# Patient Record
Sex: Male | Born: 1960 | Race: White | Hispanic: No | Marital: Married | Smoking: Never smoker
Health system: Southern US, Community
[De-identification: ages and names within clinical notes are randomized; demographics above are authoritative.]

## PROBLEM LIST (undated history)

## (undated) HISTORY — PX: NECK SURGERY: SHX720

## (undated) HISTORY — PX: BACK SURGERY: SHX140

---

## 1998-03-12 ENCOUNTER — Emergency Department (HOSPITAL_COMMUNITY): Admission: EM | Admit: 1998-03-12 | Discharge: 1998-03-12 | Payer: Self-pay | Admitting: Emergency Medicine

## 1998-06-29 ENCOUNTER — Encounter: Admission: RE | Admit: 1998-06-29 | Discharge: 1998-09-27 | Payer: Self-pay | Admitting: *Deleted

## 1998-07-03 ENCOUNTER — Encounter: Payer: Self-pay | Admitting: *Deleted

## 1998-07-03 ENCOUNTER — Ambulatory Visit (HOSPITAL_COMMUNITY): Admission: RE | Admit: 1998-07-03 | Discharge: 1998-07-03 | Payer: Self-pay | Admitting: *Deleted

## 1998-08-14 ENCOUNTER — Encounter: Payer: Self-pay | Admitting: *Deleted

## 1998-08-14 ENCOUNTER — Ambulatory Visit (HOSPITAL_COMMUNITY): Admission: RE | Admit: 1998-08-14 | Discharge: 1998-08-14 | Payer: Self-pay | Admitting: *Deleted

## 1998-09-15 ENCOUNTER — Encounter: Payer: Self-pay | Admitting: Neurosurgery

## 1998-09-15 ENCOUNTER — Ambulatory Visit (HOSPITAL_COMMUNITY): Admission: RE | Admit: 1998-09-15 | Discharge: 1998-09-15 | Payer: Self-pay | Admitting: Neurosurgery

## 1998-09-29 ENCOUNTER — Ambulatory Visit (HOSPITAL_COMMUNITY): Admission: RE | Admit: 1998-09-29 | Discharge: 1998-09-29 | Payer: Self-pay | Admitting: Neurosurgery

## 1998-09-29 ENCOUNTER — Encounter: Payer: Self-pay | Admitting: Neurosurgery

## 1998-10-13 ENCOUNTER — Ambulatory Visit (HOSPITAL_COMMUNITY): Admission: RE | Admit: 1998-10-13 | Discharge: 1998-10-13 | Payer: Self-pay | Admitting: Neurosurgery

## 1998-10-13 ENCOUNTER — Encounter: Payer: Self-pay | Admitting: Neurosurgery

## 1998-11-04 ENCOUNTER — Encounter: Payer: Self-pay | Admitting: Neurosurgery

## 1998-11-04 ENCOUNTER — Ambulatory Visit (HOSPITAL_COMMUNITY): Admission: RE | Admit: 1998-11-04 | Discharge: 1998-11-04 | Payer: Self-pay | Admitting: Neurosurgery

## 1999-01-01 ENCOUNTER — Encounter: Payer: Self-pay | Admitting: Neurosurgery

## 1999-01-05 ENCOUNTER — Encounter: Payer: Self-pay | Admitting: Neurosurgery

## 1999-01-05 ENCOUNTER — Inpatient Hospital Stay (HOSPITAL_COMMUNITY): Admission: RE | Admit: 1999-01-05 | Discharge: 1999-01-08 | Payer: Self-pay | Admitting: Neurosurgery

## 2000-03-15 ENCOUNTER — Encounter: Payer: Self-pay | Admitting: Neurosurgery

## 2000-03-15 ENCOUNTER — Encounter: Admission: RE | Admit: 2000-03-15 | Discharge: 2000-03-15 | Payer: Self-pay | Admitting: Neurosurgery

## 2001-02-21 ENCOUNTER — Ambulatory Visit (HOSPITAL_COMMUNITY): Admission: RE | Admit: 2001-02-21 | Discharge: 2001-02-21 | Payer: Self-pay | Admitting: Neurosurgery

## 2001-02-21 ENCOUNTER — Encounter: Payer: Self-pay | Admitting: Neurosurgery

## 2001-03-21 ENCOUNTER — Encounter: Admission: RE | Admit: 2001-03-21 | Discharge: 2001-03-21 | Payer: Self-pay | Admitting: Neurosurgery

## 2001-03-21 ENCOUNTER — Encounter: Payer: Self-pay | Admitting: Neurosurgery

## 2001-04-12 ENCOUNTER — Encounter: Payer: Self-pay | Admitting: Neurosurgery

## 2001-04-12 ENCOUNTER — Encounter: Admission: RE | Admit: 2001-04-12 | Discharge: 2001-04-12 | Payer: Self-pay | Admitting: Neurosurgery

## 2001-04-24 ENCOUNTER — Encounter: Payer: Self-pay | Admitting: Neurosurgery

## 2001-04-24 ENCOUNTER — Encounter: Admission: RE | Admit: 2001-04-24 | Discharge: 2001-04-24 | Payer: Self-pay | Admitting: Neurosurgery

## 2002-11-12 ENCOUNTER — Encounter: Payer: Self-pay | Admitting: Neurosurgery

## 2002-11-12 ENCOUNTER — Encounter: Payer: Self-pay | Admitting: Diagnostic Radiology

## 2002-11-12 ENCOUNTER — Encounter: Admission: RE | Admit: 2002-11-12 | Discharge: 2002-11-12 | Payer: Self-pay | Admitting: Neurosurgery

## 2002-11-26 ENCOUNTER — Encounter: Payer: Self-pay | Admitting: Neurosurgery

## 2002-11-26 ENCOUNTER — Encounter: Admission: RE | Admit: 2002-11-26 | Discharge: 2002-11-26 | Payer: Self-pay | Admitting: Neurosurgery

## 2003-07-10 ENCOUNTER — Encounter: Admission: RE | Admit: 2003-07-10 | Discharge: 2003-07-10 | Payer: Self-pay | Admitting: Internal Medicine

## 2003-09-08 ENCOUNTER — Encounter: Admission: RE | Admit: 2003-09-08 | Discharge: 2003-09-08 | Payer: Self-pay | Admitting: Neurosurgery

## 2004-04-16 ENCOUNTER — Inpatient Hospital Stay (HOSPITAL_COMMUNITY): Admission: AD | Admit: 2004-04-16 | Discharge: 2004-04-17 | Payer: Self-pay | Admitting: Neurosurgery

## 2004-05-11 ENCOUNTER — Encounter: Admission: RE | Admit: 2004-05-11 | Discharge: 2004-05-11 | Payer: Self-pay | Admitting: Neurosurgery

## 2004-06-01 ENCOUNTER — Encounter: Admission: RE | Admit: 2004-06-01 | Discharge: 2004-06-01 | Payer: Self-pay | Admitting: Neurosurgery

## 2005-06-03 ENCOUNTER — Encounter: Admission: RE | Admit: 2005-06-03 | Discharge: 2005-06-03 | Payer: Self-pay | Admitting: Neurosurgery

## 2005-10-28 ENCOUNTER — Encounter: Admission: RE | Admit: 2005-10-28 | Discharge: 2005-10-28 | Payer: Self-pay | Admitting: Neurosurgery

## 2007-10-24 IMAGING — RF DG MYELOGRAM CERVICAL
10 series · 10 of 10 positions shown · IV contrast (omnipaque)
Comparison: 06/03/05

CLINICAL DATA: Left C8 radiculopathy.
CERVICAL MYELOGRAM:
TECHNIQUE: The low back was prepped and draped in a sterile fashion. Lidocaine was utilized for local anesthesia.  Under fluoroscopic guidance, a 22 gauge spinal needle was inserted into the CSF space at L2-3 via right paramedian approach.  10 cc Omnipaque 300 was administered.  No complications were encountered.
TECHNIQUE: Multidetector CT imaging of the cervical spine was performed after intrathecal injection of contrast.  Multiplanar CT imaging reconstructions were also generated.

[Series 2: (hospital) · 1 of 1 slices shown]
[im 1/1]
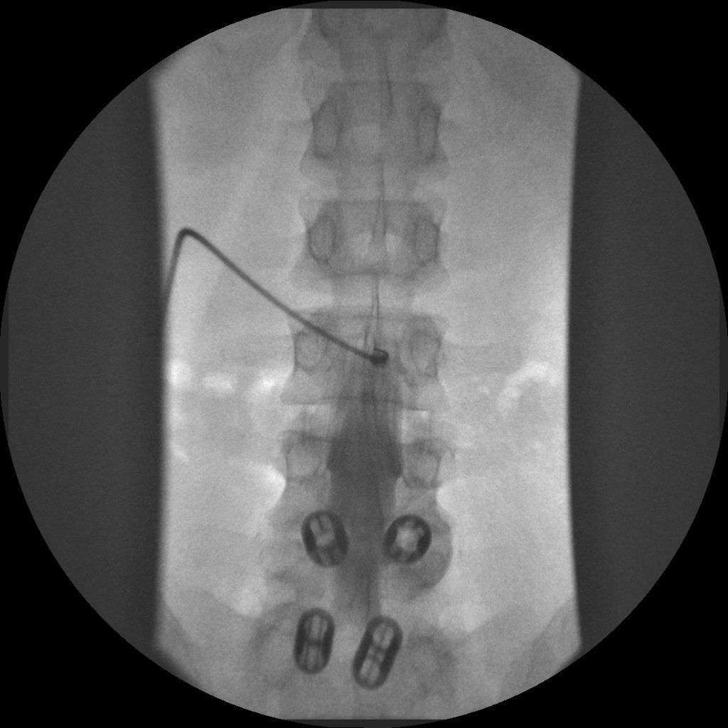

[Series 3: myelogram  white · 1 of 1 slices shown (1 of 9)]
[im 1/1]
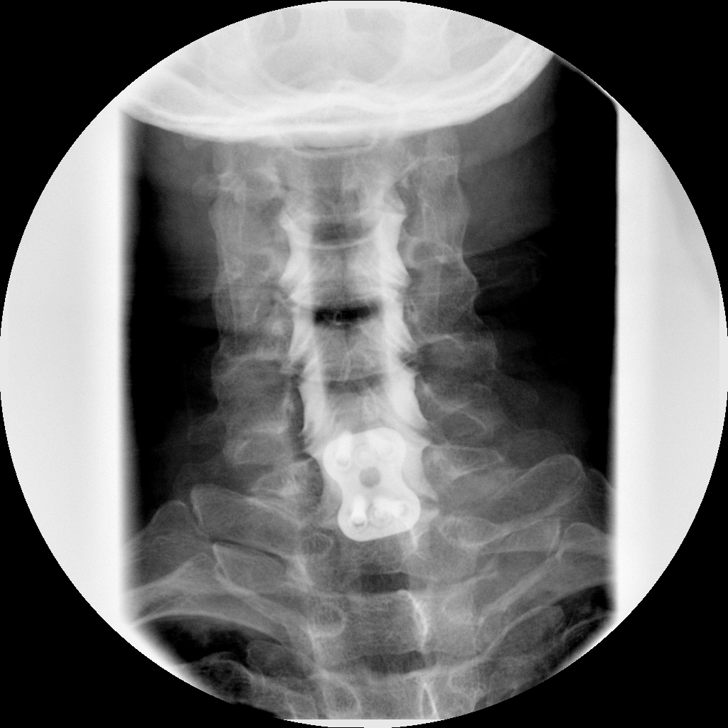

[Series 4: myelogram  white · 1 of 1 slices shown (2 of 9)]
[im 1/1]
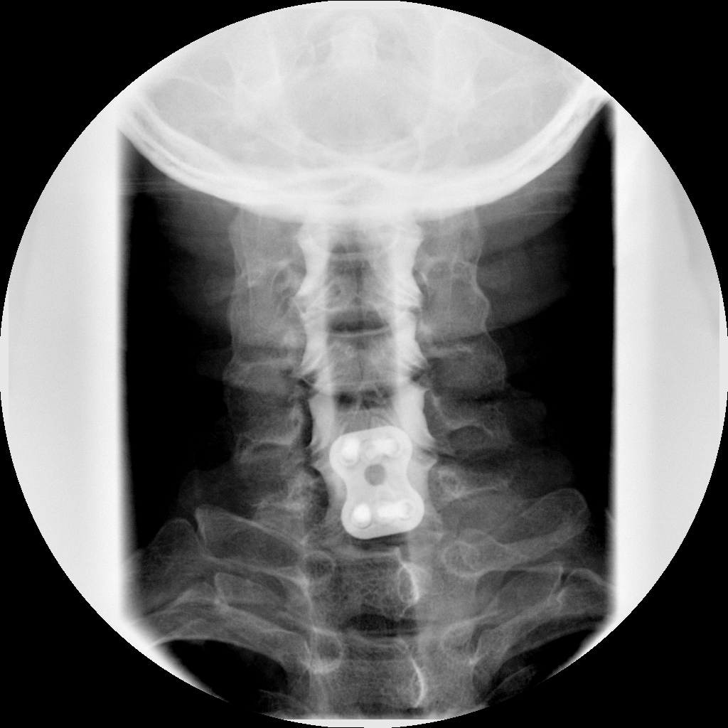

[Series 5: myelogram  white · 1 of 1 slices shown (3 of 9)]
[im 1/1]
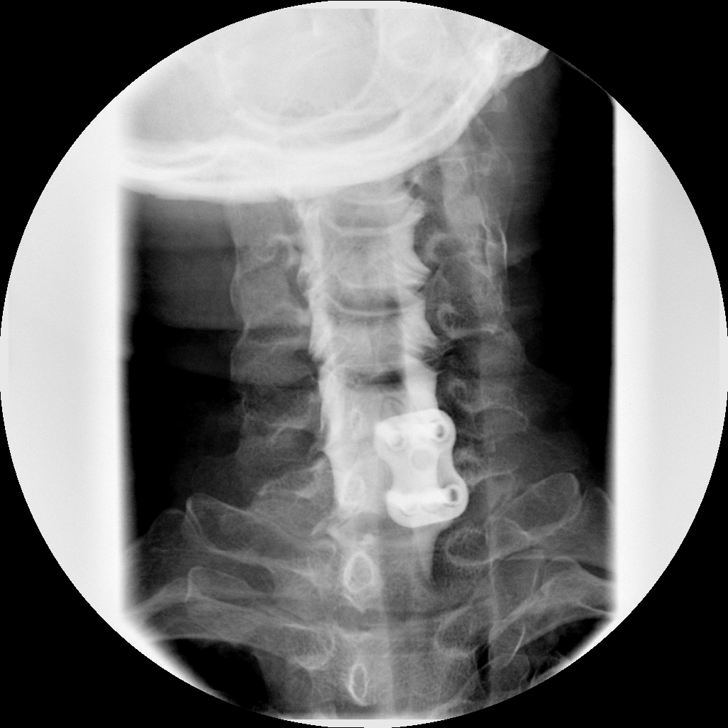

[Series 6: myelogram  white · 1 of 1 slices shown (4 of 9)]
[im 1/1]
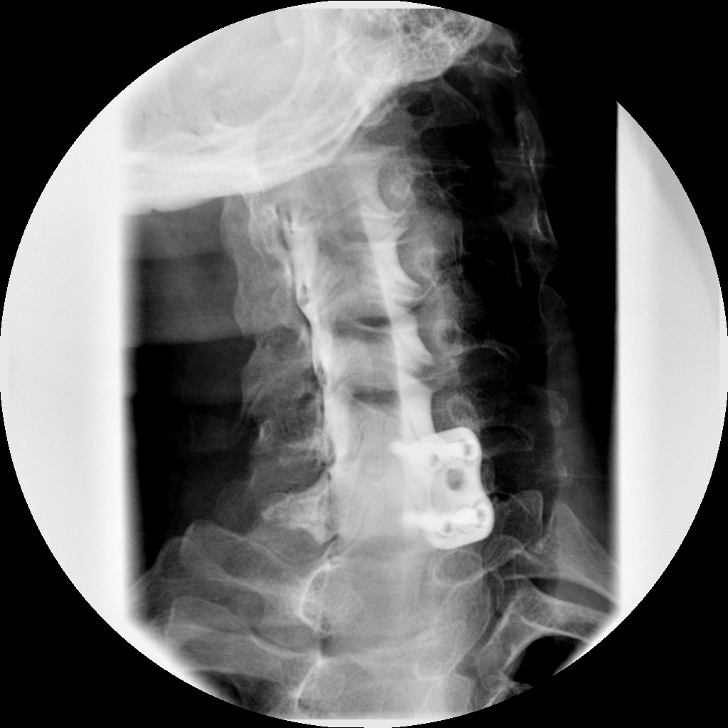

[Series 7: myelogram  white · 1 of 1 slices shown (5 of 9)]
[im 1/1]
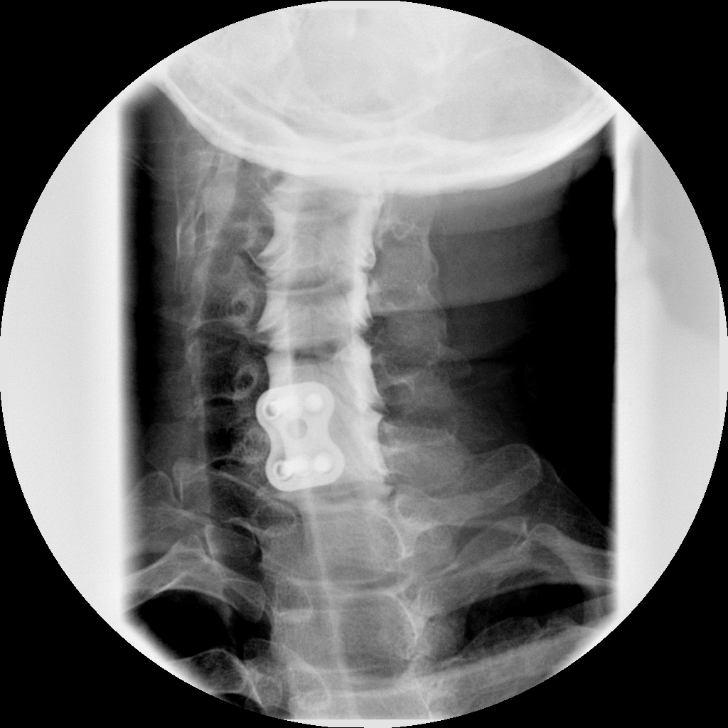

[Series 8: myelogram  white · 1 of 1 slices shown (6 of 9)]
[im 1/1]
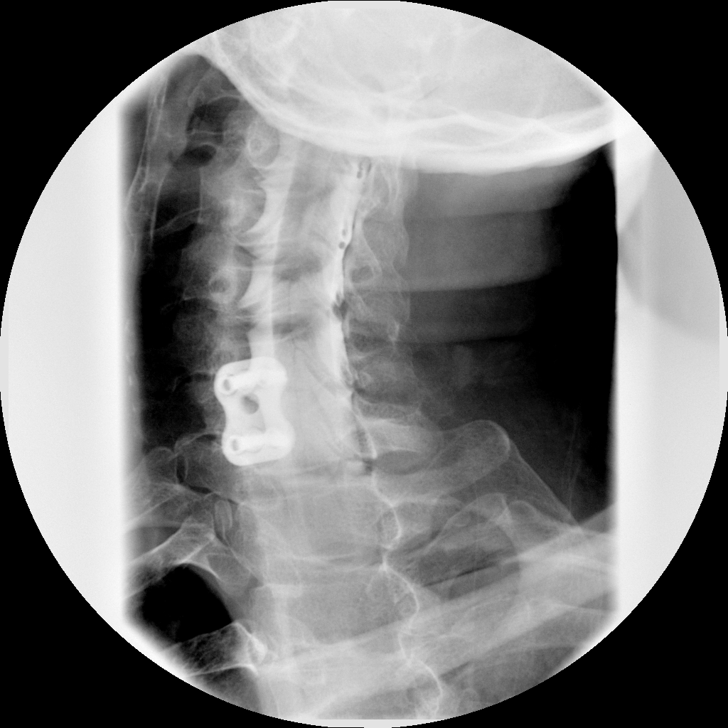

[Series 9: myelogram  white · 1 of 1 slices shown (7 of 9)]
[im 1/1]
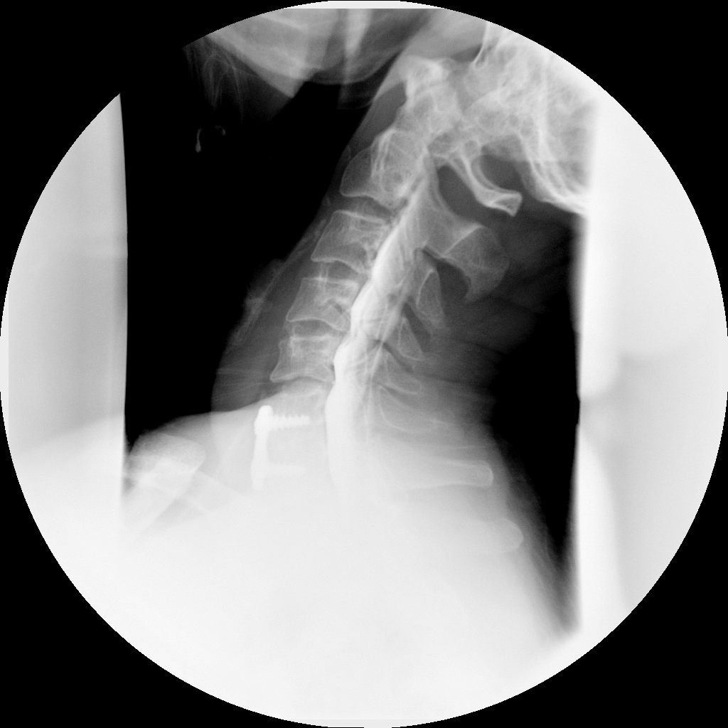

[Series 10: myelogram  white · 1 of 1 slices shown (8 of 9)]
[im 1/1]
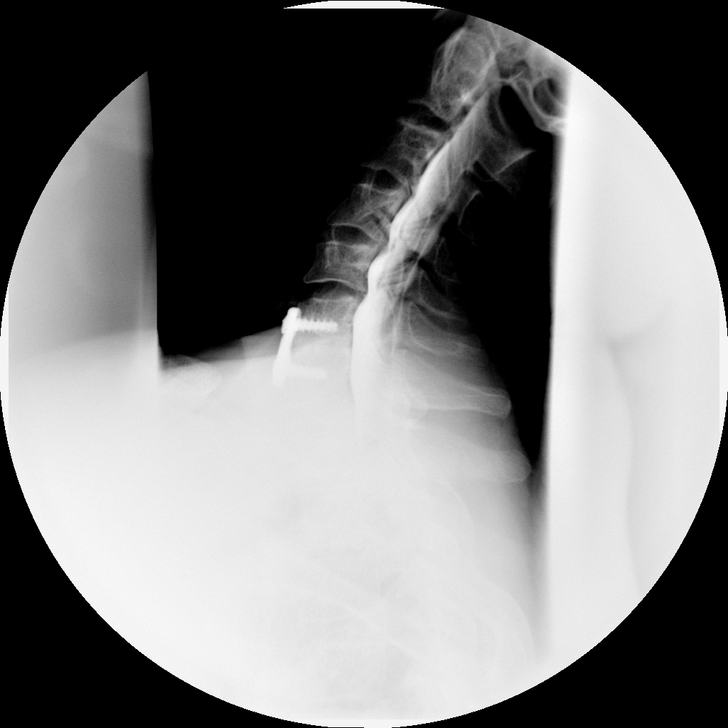

[Series 11: myelogram  white · 1 of 1 slices shown (9 of 9)]
[im 1/1]
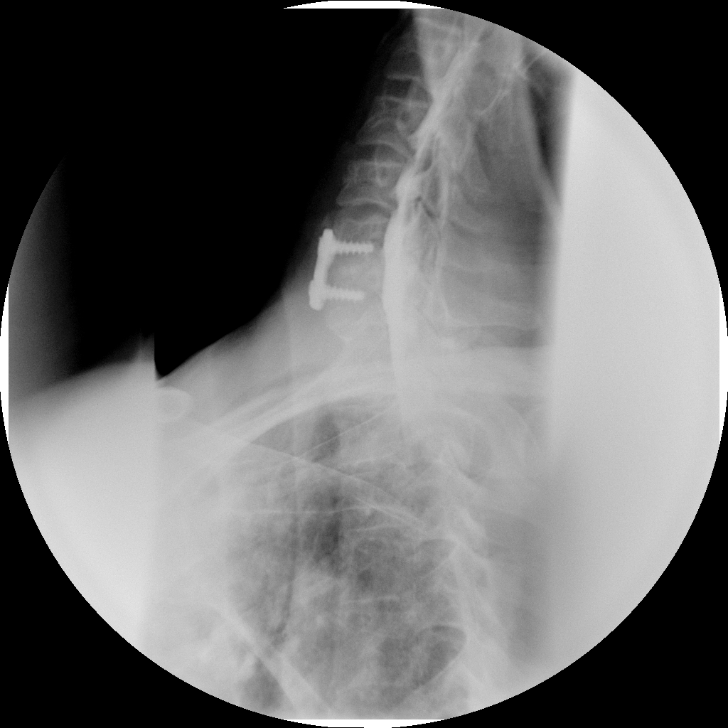

[10 of 10 positions shown; findings below may reference images not displayed]

FINDINGS: The thecal space is well-filled with contrast.  There is no evidence of nerve root effacement.  Specifically, the left C8 nerve root sleeve is widely patent without evidence of effacement or mass effect.  Anterior plates and screws are appropriately positioned in C6 and C7 with solid fusion.  There is no breakage or loosening of the hardware.  Mild anterior epidural mass effect at C5-6 is noted due to mild posterior osteophytes.
IMPRESSION: Cervical myelogram in preparation for CT.  The left C8 nerve root sleeve is not effaced. 
POST MYELOGRAM CT SCAN OF THE CERVICAL SPINE:
FINDINGS: Axial images were reconstructed in multiplanar format.  The craniocervical junction is within normal limits.  There is anatomic alignment of the vertebral bodies.  Anterior fusion at C6-7 with an interposed bone graft is solid.  There is no breakage or loosening of the hardware.  
C2-3:  Unremarkable.
C3-4:  Minimal bilateral uncovertebral osteophytes, but no central or foraminal stenosis. 
C4-5:  Minimal posterior and uncovertebral osteophytes, but no central or foraminal stenosis.  Nerve root sleeves are not effaced. 
C5-6:  Minimal uncovertebral osteophytes, but no significant central or foraminal stenosis.  
C6-7:  Unremarkable.  Specifically, no C8 nerve root sleeve effacement.  No foraminal narrowing.
IMPRESSION: 1.  Solid fusion from C6 to C7.
2.  No evidence of left C8 nerve root sleeve effacement.  
3.  Minimal degenerative changes at C4-5 and C5-6 without stenosis or neural impingement.
4.  Overall no significant change compared to the myelogram from [DATE].

## 2014-08-15 ENCOUNTER — Other Ambulatory Visit (HOSPITAL_BASED_OUTPATIENT_CLINIC_OR_DEPARTMENT_OTHER): Payer: Self-pay | Admitting: Neurosurgery

## 2014-08-15 ENCOUNTER — Ambulatory Visit (HOSPITAL_BASED_OUTPATIENT_CLINIC_OR_DEPARTMENT_OTHER)
Admission: RE | Admit: 2014-08-15 | Discharge: 2014-08-15 | Disposition: A | Discharge status: Home / Self Care | Payer: BLUE CROSS/BLUE SHIELD | Source: Ambulatory Visit | Attending: Neurosurgery | Admitting: Neurosurgery

## 2014-08-15 DIAGNOSIS — M47812 Spondylosis without myelopathy or radiculopathy, cervical region: Secondary | ICD-10-CM

## 2014-08-15 DIAGNOSIS — M542 Cervicalgia: Secondary | ICD-10-CM | POA: Insufficient documentation

## 2014-08-15 DIAGNOSIS — Z981 Arthrodesis status: Secondary | ICD-10-CM | POA: Diagnosis not present

## 2014-08-15 DIAGNOSIS — M5023 Other cervical disc displacement, cervicothoracic region: Secondary | ICD-10-CM

## 2018-10-18 ENCOUNTER — Other Ambulatory Visit: Payer: Self-pay

## 2018-10-18 ENCOUNTER — Emergency Department (INDEPENDENT_AMBULATORY_CARE_PROVIDER_SITE_OTHER): Payer: Medicare HMO

## 2018-10-18 ENCOUNTER — Encounter: Payer: Self-pay | Admitting: Emergency Medicine

## 2018-10-18 ENCOUNTER — Emergency Department
Admission: EM | Admit: 2018-10-18 | Discharge: 2018-10-18 | Disposition: A | Discharge status: Home / Self Care | Payer: Medicare HMO | Source: Home / Self Care

## 2018-10-18 DIAGNOSIS — M79672 Pain in left foot: Secondary | ICD-10-CM | POA: Diagnosis not present

## 2018-10-18 DIAGNOSIS — S91332A Puncture wound without foreign body, left foot, initial encounter: Secondary | ICD-10-CM | POA: Diagnosis not present

## 2018-10-18 DIAGNOSIS — Z23 Encounter for immunization: Secondary | ICD-10-CM

## 2018-10-18 MED ORDER — AMOXICILLIN-POT CLAVULANATE 875-125 MG PO TABS: 1.0000 | ORAL_TABLET | Freq: Two times a day (BID) | ORAL | 0 refills | Status: DC

## 2018-10-18 MED ORDER — TETANUS-DIPHTH-ACELL PERTUSSIS 5-2.5-18.5 LF-MCG/0.5 IM SUSP
0.5000 mL | Freq: Once | INTRAMUSCULAR | Status: AC
  Administered 2018-10-18: 0.5 mL via INTRAMUSCULAR

## 2018-10-18 NOTE — Discharge Instructions (Signed)
°  Please take antibiotics as prescribed and be sure to complete entire course to help prevent infection.   Please follow up with sports medicine or return to urgent care for a wound recheck if not improving in 4-5 days, sooner if concern for infection- including increased pain, redness, swelling, drainage of pus.

## 2018-10-18 NOTE — ED Provider Notes (Signed)
[facility]    CSN: [id number] Arrival date & time: 10/18/18  [id number]     History   Chief Complaint Chief Complaint  Patient presents with   Foot Injury    HPI Dustin Pearson is a 58 y.o. male.   HPI Dustin Pearson is a 58 y.o. male presenting to UC with c/o Left foot pain after stepping down off a ladder onto a wood board with 2 metal nails that punctured through his shoe, into his foot. He is due for a tetanus booster. Pain is worse around his 3rd metatarsal joint on the plantar and dorsal aspect of his foot, worse with movement of his toes.  He has neuropathy but pain is way worse than normal.  He took ibuprofen and OxyContin, which was leftover from a shoulder surgery but only minimal relief. He has been using crutches to help get around.     History reviewed. No pertinent past medical history.  There are no active problems to display for this patient.   History reviewed. No pertinent surgical history.     Home Medications    Prior to Admission medications   Medication Sig Start Date End Date Taking? Authorizing Provider  amoxicillin-clavulanate (AUGMENTIN) 875-125 MG tablet Take 1 tablet by mouth 2 (two) times daily. One po bid x 7 days 10/18/18   Lurene ShadowPhelps, Draysen Weygandt O, PA-C    Family History Family History  Problem Relation Age of Onset   Healthy Mother    Healthy Father     Social History Social History   Tobacco Use   Smoking status: Never Smoker   Smokeless tobacco: Never Used  Substance Use Topics   Alcohol use: Not Currently   Drug use: Not Currently     Allergies   Patient has no known allergies.   Review of Systems Review of Systems  Constitutional: Negative for chills and fever.  Musculoskeletal: Positive for arthralgias, gait problem, joint swelling and myalgias.  Skin: Positive for color change and wound.  Neurological: Negative for weakness and numbness.     Physical Exam Triage Vital Signs ED Triage Vitals  Enc Vitals  Group     BP      Pulse      Resp      Temp      Temp src      SpO2      Weight      Height      Head Circumference      Peak Flow      Pain Score      Pain Loc      Pain Edu?      Excl. in GC?    No data found.  Updated Vital Signs BP (!) 151/97 (BP Location: Right Arm)    Pulse 84    Temp 97.7 F (36.5 C) (Oral)    Ht 6\' 3"  (1.905 m)    Wt 224 lb (101.6 kg)    SpO2 100%    BMI 28.00 kg/m   Visual Acuity Right Eye Distance:   Left Eye Distance:   Bilateral Distance:    Right Eye Near:   Left Eye Near:    Bilateral Near:     Physical Exam Vitals signs and nursing note reviewed.  Constitutional:      Appearance: Normal appearance. He is well-developed.  HENT:     Head: Normocephalic and atraumatic.  Neck:     Musculoskeletal: Normal range of motion.  Cardiovascular:  Rate and Rhythm: Normal rate.  Pulmonary:     Effort: Pulmonary effort is normal.  Musculoskeletal: Normal range of motion.        General: Swelling and tenderness present.       Feet:     Comments: Left foot: mild edema and tenderness around 3rd toe. Tender. Full ROM toes and ankle.   Skin:    General: Skin is warm and dry.     Capillary Refill: Capillary refill takes less than 2 seconds.     Comments: Left foot, plantar aspect: puncture wound over 3rd metatarsal joint and smaller puncture wound proximal to 2nd metatarsal joint. Faint erythema. No bleeding or discharge.  Neurological:     Mental Status: He is alert and oriented to person, place, and time.  Psychiatric:        Behavior: Behavior normal.      UC Treatments / Results  Labs (all labs ordered are listed, but only abnormal results are displayed) Labs Reviewed - No data to display  EKG None  Radiology Dg Foot Complete Left  Result Date: 10/18/2018 CLINICAL DATA:  Pain after stepping on nail EXAM: LEFT FOOT - COMPLETE 3+ VIEW COMPARISON:  None. FINDINGS: Frontal, oblique, and lateral views obtained. There is no  appreciable fracture or dislocation. No there is no radiopaque foreign body or soft tissue air. There is pes cavus. No appreciable joint space narrowing or erosion. IMPRESSION: No radiopaque foreign body or soft tissue air. No fracture or dislocation. No evident arthropathy. There is pes cavus. Electronically Signed   By: Bretta Bang III M.D.   On: 10/18/2018 10:28    Procedures Procedures (including critical care time)  Medications Ordered in UC Medications  Tdap (BOOSTRIX) injection 0.5 mL (0.5 mLs Intramuscular Given 10/18/18 1050)    Initial Impression / Assessment and Plan / UC Course  I have reviewed the triage vital signs and the nursing notes.  Pertinent labs & imaging results that were available during my care of the patient were reviewed by me and considered in my medical decision making (see chart for details).     Reassured pt of normal imaging Wounds were soaked in warm water and Hibiclens. Pat dried. Bacitracin and bandage applied. Tdap updated Home care info provided. Encouraged to take Augmentin to help prevent infection.  Final Clinical Impressions(s) / UC Diagnoses   Final diagnoses:  Puncture wound of left foot, initial encounter     Discharge Instructions      Please take antibiotics as prescribed and be sure to complete entire course to help prevent infection.   Please follow up with sports medicine or return to urgent care for a wound recheck if not improving in 4-5 days, sooner if concern for infection- including increased pain, redness, swelling, drainage of pus.      ED Prescriptions    Medication Sig Dispense Auth. Provider   amoxicillin-clavulanate (AUGMENTIN) 875-125 MG tablet Take 1 tablet by mouth 2 (two) times daily. One po bid x 7 days 14 tablet Lurene Shadow, [place]     Controlled Substance Prescriptions  Controlled Substance Registry consulted? Not Applicable   Rolla Plate 10/18/18 1259

## 2018-10-18 NOTE — ED Triage Notes (Signed)
Left foot injury yesterday stepped on a board with 2 nails in it, 2 puncture wounds on bottom of foot, painful and swollen

## 2020-10-13 IMAGING — DX LEFT FOOT - COMPLETE 3+ VIEW
3 series · 3 of 3 positions shown · non-contrast
Comparison: None.

CLINICAL DATA: Pain after stepping on nail

EXAM:
LEFT FOOT - COMPLETE 3+ VIEW

[foot ap]
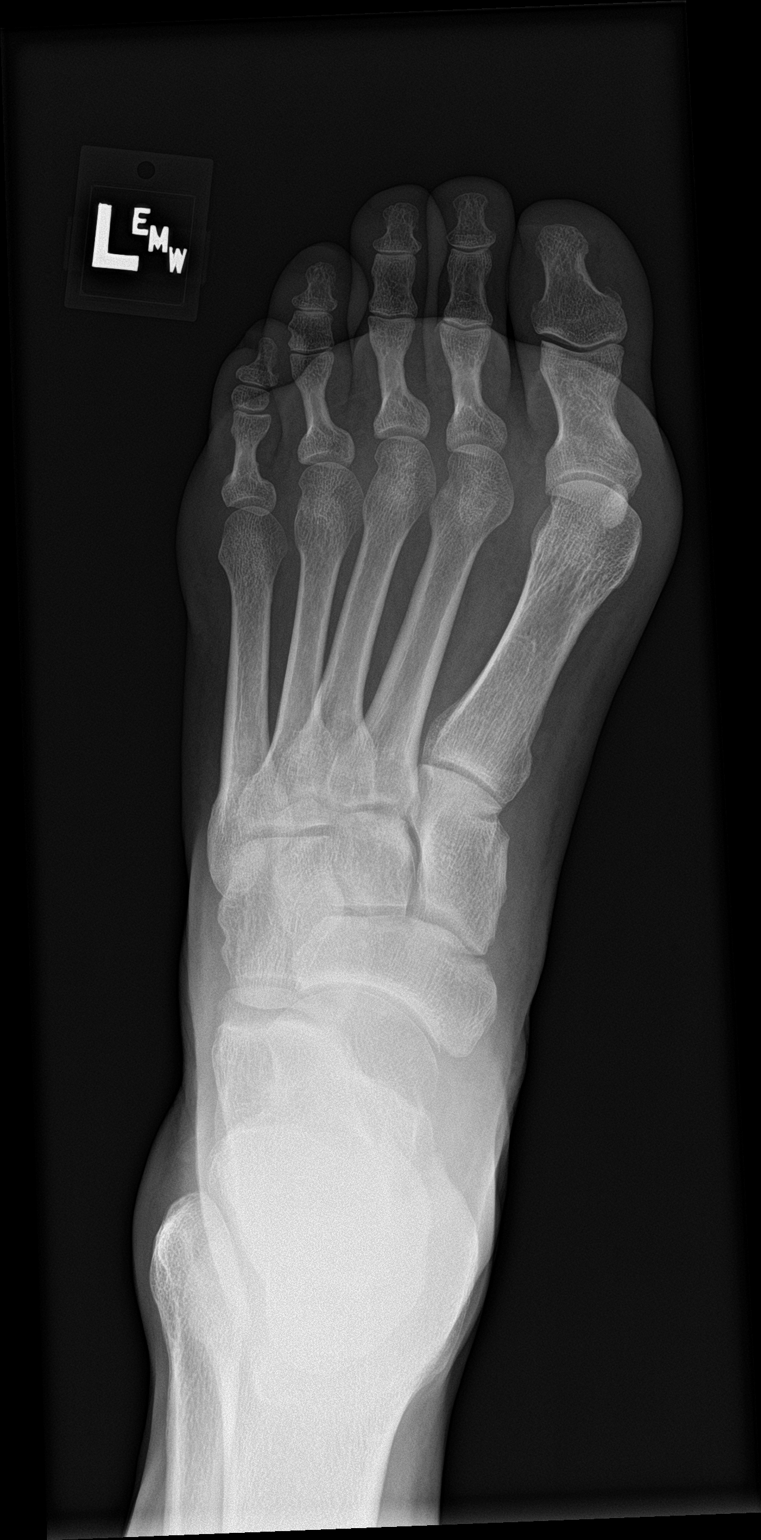

[foot obl]
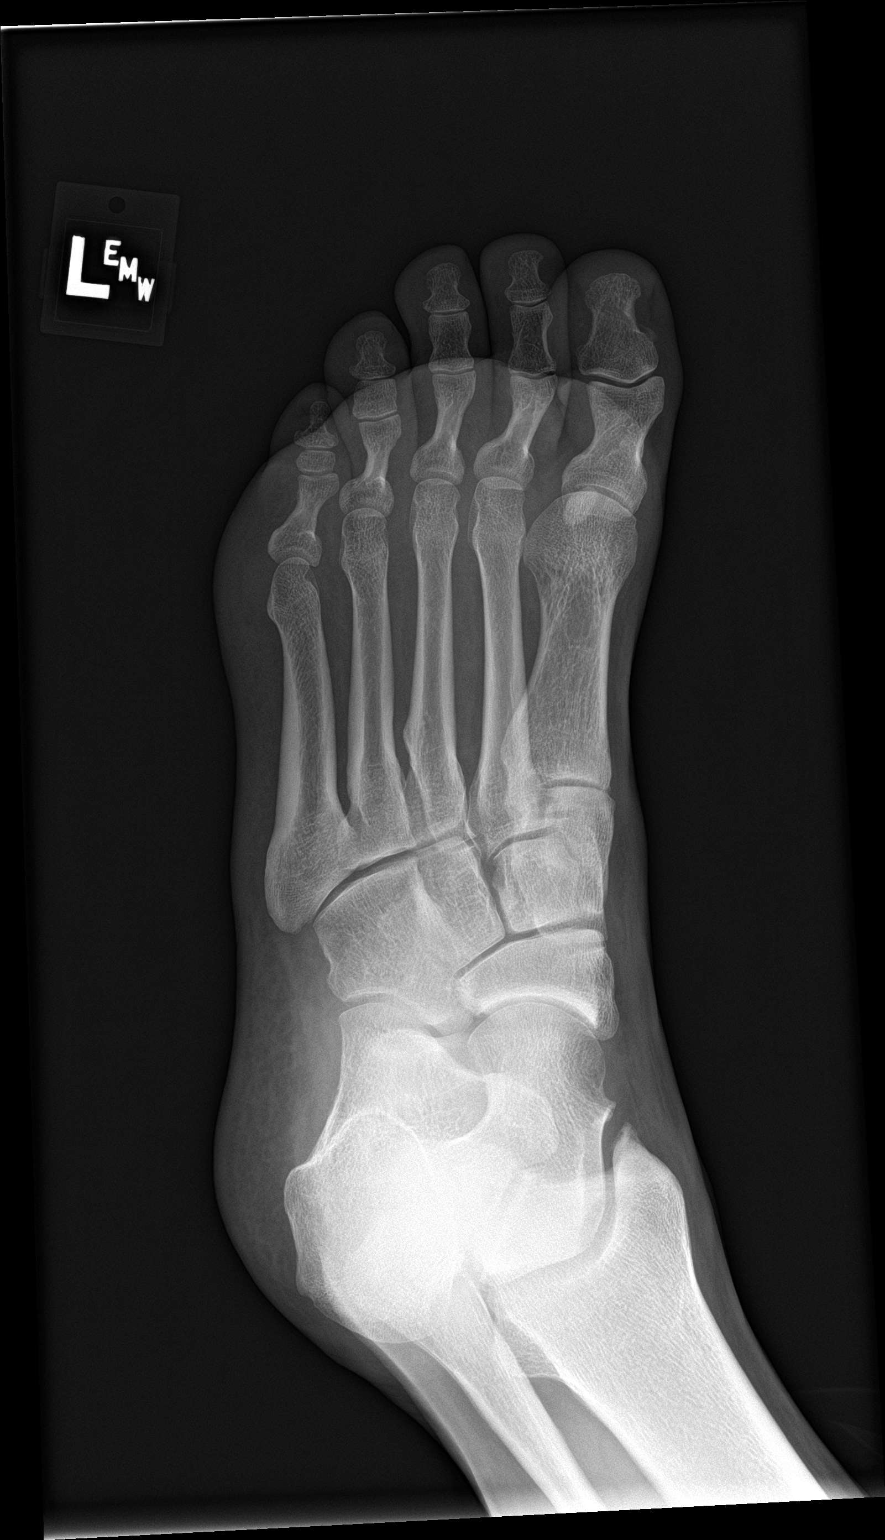

[foot lat]
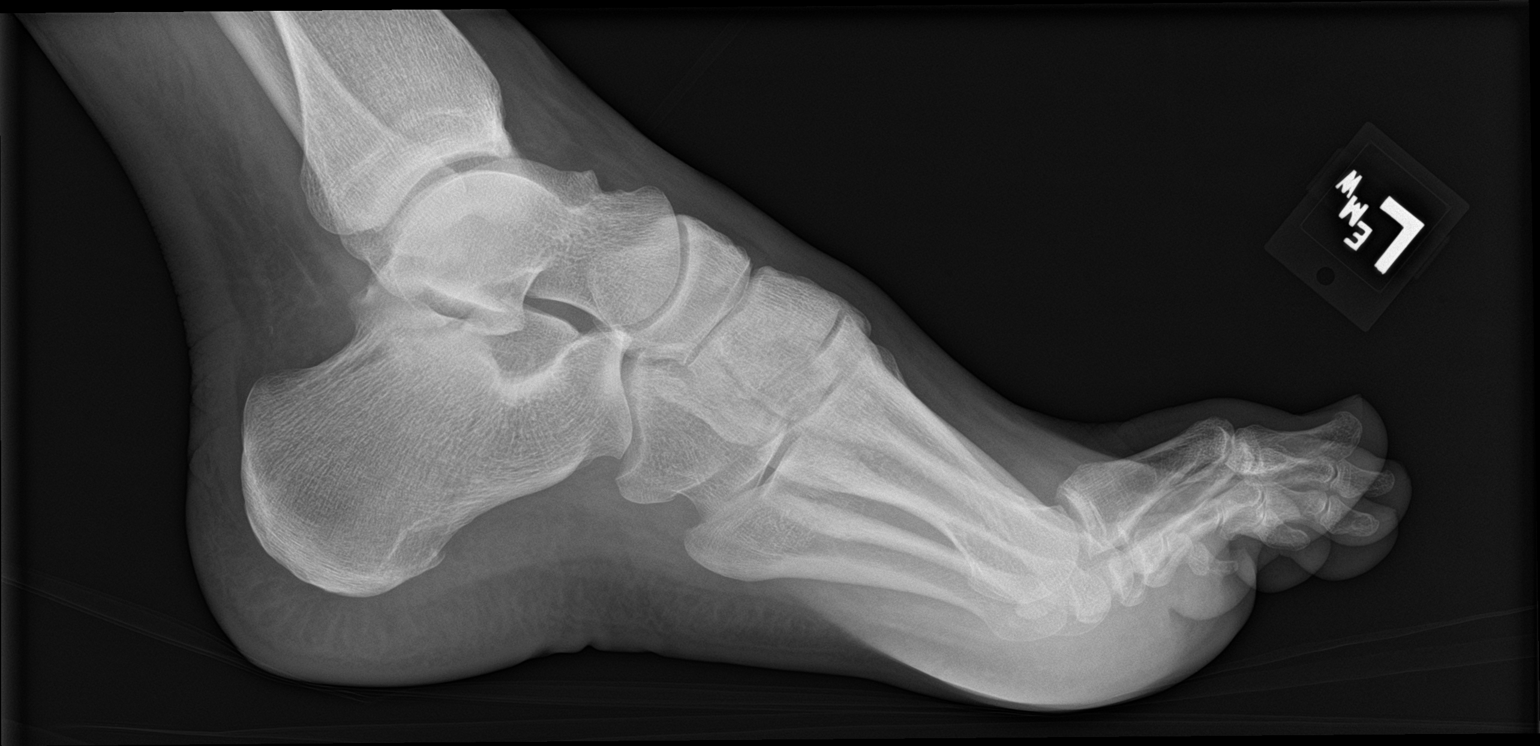

[3 of 3 positions shown; findings below may reference images not displayed]

FINDINGS: Frontal, oblique, and lateral views obtained. There is no
appreciable fracture or dislocation. No there is no radiopaque
foreign body or soft tissue air. There is Carl Rith Andredson. No appreciable
joint space narrowing or erosion.
IMPRESSION: No radiopaque foreign body or soft tissue air. No fracture or
dislocation. No evident arthropathy. There is Carl Rith Andredson.

## 2020-11-25 ENCOUNTER — Encounter (HOSPITAL_COMMUNITY): Payer: Self-pay

## 2020-11-25 ENCOUNTER — Emergency Department (HOSPITAL_COMMUNITY): Payer: Medicare HMO

## 2020-11-25 ENCOUNTER — Inpatient Hospital Stay (HOSPITAL_COMMUNITY)
Admission: EM | Admit: 2020-11-25 | Discharge: 2020-11-27 | DRG: 394 | Disposition: A | Discharge status: Home / Self Care | Payer: Medicare HMO | Attending: Family Medicine | Admitting: Family Medicine

## 2020-11-25 DIAGNOSIS — K921 Melena: Secondary | ICD-10-CM

## 2020-11-25 DIAGNOSIS — E162 Hypoglycemia, unspecified: Secondary | ICD-10-CM | POA: Diagnosis present

## 2020-11-25 DIAGNOSIS — Q8909 Congenital malformations of spleen: Secondary | ICD-10-CM

## 2020-11-25 DIAGNOSIS — K625 Hemorrhage of anus and rectum: Secondary | ICD-10-CM

## 2020-11-25 DIAGNOSIS — K529 Noninfective gastroenteritis and colitis, unspecified: Secondary | ICD-10-CM | POA: Diagnosis not present

## 2020-11-25 DIAGNOSIS — G8929 Other chronic pain: Secondary | ICD-10-CM | POA: Diagnosis present

## 2020-11-25 DIAGNOSIS — Z79899 Other long term (current) drug therapy: Secondary | ICD-10-CM

## 2020-11-25 DIAGNOSIS — Z888 Allergy status to other drugs, medicaments and biological substances status: Secondary | ICD-10-CM

## 2020-11-25 DIAGNOSIS — K559 Vascular disorder of intestine, unspecified: Principal | ICD-10-CM | POA: Diagnosis present

## 2020-11-25 DIAGNOSIS — K760 Fatty (change of) liver, not elsewhere classified: Secondary | ICD-10-CM | POA: Diagnosis present

## 2020-11-25 DIAGNOSIS — K4020 Bilateral inguinal hernia, without obstruction or gangrene, not specified as recurrent: Secondary | ICD-10-CM | POA: Diagnosis present

## 2020-11-25 DIAGNOSIS — Z20822 Contact with and (suspected) exposure to covid-19: Secondary | ICD-10-CM | POA: Diagnosis present

## 2020-11-25 DIAGNOSIS — M549 Dorsalgia, unspecified: Secondary | ICD-10-CM | POA: Diagnosis present

## 2020-11-25 LAB — CBC
HCT: 45.9 % (ref 39.0–52.0)
Hemoglobin: 15.9 g/dL (ref 13.0–17.0)
MCH: 32.3 pg (ref 26.0–34.0)
MCHC: 34.6 g/dL (ref 30.0–36.0)
MCV: 93.1 fL (ref 80.0–100.0)
Platelets: 176 10*3/uL (ref 150–400)
RBC: 4.93 MIL/uL (ref 4.22–5.81)
RDW: 12.4 % (ref 11.5–15.5)
WBC: 10.8 10*3/uL — ABNORMAL HIGH (ref 4.0–10.5)
nRBC: 0 % (ref 0.0–0.2)

## 2020-11-25 LAB — COMPREHENSIVE METABOLIC PANEL
ALT: 25 U/L (ref 0–44)
AST: 29 U/L (ref 15–41)
Albumin: 4.7 g/dL (ref 3.5–5.0)
Alkaline Phosphatase: 76 U/L (ref 38–126)
Anion gap: 8 (ref 5–15)
BUN: 16 mg/dL (ref 6–20)
CO2: 25 mmol/L (ref 22–32)
Calcium: 9.4 mg/dL (ref 8.9–10.3)
Chloride: 107 mmol/L (ref 98–111)
Creatinine, Ser: 1.35 mg/dL — ABNORMAL HIGH (ref 0.61–1.24)
GFR, Estimated: >60 mL/min (ref >60)
Glucose, Bld: 68 mg/dL — ABNORMAL LOW (ref 70–99)
Potassium: 3.4 mmol/L — ABNORMAL LOW (ref 3.5–5.1)
Sodium: 140 mmol/L (ref 135–145)
Total Bilirubin: 0.7 mg/dL (ref 0.3–1.2)
Total Protein: 7.8 g/dL (ref 6.5–8.1)

## 2020-11-25 LAB — POC OCCULT BLOOD, ED: Fecal Occult Bld: POSITIVE — AB

## 2020-11-25 LAB — CBG MONITORING, ED
Glucose-Capillary: 39 mg/dL — CL (ref 70–99)
Glucose-Capillary: 52 mg/dL — ABNORMAL LOW (ref 70–99)
Glucose-Capillary: 59 mg/dL — ABNORMAL LOW (ref 70–99)

## 2020-11-25 MED ORDER — METRONIDAZOLE 500 MG PO TABS
500.0000 mg | ORAL_TABLET | Freq: Three times a day (TID) | ORAL | Status: DC
  Administered 2020-11-26: 500 mg via ORAL
  Filled 2020-11-25: qty 1

## 2020-11-25 MED ORDER — DEXTROSE 50 % IV SOLN
25.0000 mL | Freq: Once | INTRAVENOUS | Status: AC
  Administered 2020-11-26: 25 mL via INTRAVENOUS
  Filled 2020-11-25: qty 50

## 2020-11-25 MED ORDER — IOHEXOL 300 MG/ML  SOLN
100.0000 mL | Freq: Once | INTRAMUSCULAR | Status: AC | PRN
  Administered 2020-11-25: 100 mL via INTRAVENOUS

## 2020-11-25 MED ORDER — CIPROFLOXACIN IN D5W 400 MG/200ML IV SOLN
400.0000 mg | Freq: Once | INTRAVENOUS | Status: AC
  Administered 2020-11-26: 400 mg via INTRAVENOUS
  Filled 2020-11-25: qty 200

## 2020-11-25 MED ORDER — SODIUM CHLORIDE 0.9 % IV BOLUS
1000.0000 mL | Freq: Once | INTRAVENOUS | Status: AC
  Administered 2020-11-25: 1000 mL via INTRAVENOUS

## 2020-11-25 MED ORDER — SODIUM CHLORIDE (PF) 0.9 % IJ SOLN
INTRAMUSCULAR | Status: AC
  Filled 2020-11-25: qty 50

## 2020-11-25 NOTE — ED Notes (Signed)
Pt transported to CT ?

## 2020-11-25 NOTE — ED Provider Notes (Signed)
Emergency Medicine Provider Triage Evaluation Note  Dustin Pearson , a 60 y.o. male  was evaluated in triage.  Pt complains of blood in his stool.  Patient had lower abdominal pain around 3 AM this morning.  He has several BMs and noticed some blood-tinged.  He then had another bowel movement which he states was "nothing of blood".  He denies any dizziness, syncope.  Abdominal pain is minimal currently.  He called his PCP and was told to come to the ER.  Review of Systems  Positive: As above Negative: As above  Physical Exam  BP 134/85 (BP Location: Left Arm)   Pulse 74   Temp 97.8 F (36.6 C)   Resp 18   SpO2 100%  Gen:   Awake, no distress   Resp:  Normal effort  MSK:   Moves extremities without difficulty  Other:    Medical Decision Making  Medically screening exam initiated at 8:44 PM.  Appropriate orders placed.  Dustin Pearson was informed that the remainder of the evaluation will be completed by another provider, this initial triage assessment does not replace that evaluation, and the importance of remaining in the ED until their evaluation is complete.     Leone Brand 11/25/20 2045    Cheryll Cockayne, MD 12/04/20 [phone]

## 2020-11-25 NOTE — ED Provider Notes (Signed)
[facility]-EMERGENCY DEPT Provider Note   CSN: [id number] Arrival date & time: 11/25/20  1659     History Chief Complaint  Patient presents with  . Rectal Bleeding  . Abdominal Pain    Dustin Pearson is a 60 y.o. male.  60 year old male with no significant past medical history presents to the emergency department for evaluation of lower abdominal pain as well as hematochezia.  Reports that his abdominal pain began around 3 AM.  This has remained persistent, present in his suprapubic region and left lower quadrant.  Denies any modifying factors of his pain as well as radiation of the pain.  Initially his bowel movements were formed and normal.  Began noticing more diarrheal bowel movements at 6 AM.  These had specks of bright red blood.  His bowel movements changed to only blood at 11 AM.  These have continued throughout the day.  Patient reports [facility]. No hx of abdominal surgeries. Not chronically anticoagulated. Denies recent travel, recent abx use, questionable food ingestion inciting symptoms. No fevers, vomiting, urinary symptoms, melena.  The history is provided by the patient. No language interpreter was used.  Rectal Bleeding Associated symptoms: abdominal pain   Abdominal Pain Associated symptoms: hematochezia       History reviewed. No pertinent past medical history.  Patient Active Problem List   Diagnosis Date Noted  . Colitis 11/26/2020  . Bright red blood per rectum 11/26/2020    History reviewed. No pertinent surgical history.     Family History  Problem Relation Age of Onset  . Healthy Mother   . Healthy Father     Social History   Tobacco Use  . Smoking status: Never  . Smokeless tobacco: Never  Vaping Use  . Vaping Use: Never used  Substance Use Topics  . Alcohol use: Not Currently  . Drug use: Not Currently    Home Medications Prior to Admission medications   Medication Sig Start Date  End Date Taking? Authorizing Provider  acyclovir (ZOVIRAX) 400 MG tablet Take 1 tablet by mouth 2 (two) times daily. 08/14/15  Yes [provider]  cyclobenzaprine (FLEXERIL) 10 MG tablet Take 10 mg by mouth 2 (two) times daily.   Yes [provider]  DULoxetine (CYMBALTA) 20 MG capsule Take 20 mg by mouth 2 (two) times daily. 04/27/20  Yes [provider]  Multiple Vitamin (MULTIVITAMIN WITH MINERALS) TABS tablet Take 1 tablet by mouth daily.   Yes [provider]  omeprazole (PRILOSEC) 20 MG capsule Take 20 mg by mouth daily. 09/28/20  Yes [provider]  rosuvastatin (CRESTOR) 10 MG tablet Take 10 mg by mouth at bedtime. 09/28/20  Yes [provider]  tiZANidine (ZANAFLEX) 4 MG tablet Take 4 mg by mouth at bedtime. 10/21/20  Yes [provider]  amoxicillin-clavulanate (AUGMENTIN) 875-125 MG tablet Take 1 tablet by mouth 2 (two) times daily. One po bid x 7 days Patient not taking: Reported on 11/25/2020 10/18/18   Lurene ShadowPhelps, Erin O, PA-C    Allergies    Gabapentin  Review of Systems   Review of Systems  Gastrointestinal:  Positive for abdominal pain and hematochezia.  Ten systems reviewed and are negative for acute change, except as noted in the HPI.    Physical Exam Updated Vital Signs BP (!) 145/88   Pulse 60   Temp 97.8 F (36.6 C)   Resp 18   SpO2 100%   Physical Exam Vitals and  nursing note reviewed.  Constitutional:      General: He is not in acute distress.    Appearance: He is well-developed. He is not diaphoretic.     Comments: Nontoxic appearing, pleasant male  HENT:     Head: Normocephalic and atraumatic.  Eyes:     General: No scleral icterus.    Conjunctiva/sclera: Conjunctivae normal.  Cardiovascular:     Rate and Rhythm: Normal rate and regular rhythm.     Pulses: Normal pulses.  Pulmonary:     Effort: Pulmonary effort is normal. No respiratory distress.     Comments: Respirations even and  unlabored Abdominal:     General: There is no distension.     Palpations: Abdomen is soft.     Comments: Mild focal LLQ and suprapubic TTP. No peritoneal signs, palpable masses.  Genitourinary:    Comments: Normal rectal tone. No anal fissure, external or internal hemorrhoids. Speck of BRBPR without melena. Exam chaperoned by RN. Musculoskeletal:        General: Normal range of motion.     Cervical back: Normal range of motion.  Skin:    General: Skin is warm and dry.     Coloration: Skin is not pale.     Findings: No erythema or rash.  Neurological:     Mental Status: He is alert and oriented to person, place, and time.  Psychiatric:        Behavior: Behavior normal.    ED Results / Procedures / Treatments   Labs (all labs ordered are listed, but only abnormal results are displayed) Labs Reviewed  COMPREHENSIVE METABOLIC PANEL - Abnormal; Notable for the following components:      Result Value   Potassium 3.4 (*)    Glucose, Bld 68 (*)    Creatinine, Ser 1.35 (*)    All other components within normal limits  CBC - Abnormal; Notable for the following components:   WBC 10.8 (*)    All other components within normal limits  PROTIME-INR - Abnormal; Notable for the following components:   Prothrombin Time 30.0 (*)    INR 2.9 (*)    All other components within normal limits  APTT - Abnormal; Notable for the following components:   aPTT 62 (*)    All other components within normal limits  POC OCCULT BLOOD, ED - Abnormal; Notable for the following components:   Fecal Occult Bld POSITIVE (*)    All other components within normal limits  CBG MONITORING, ED - Abnormal; Notable for the following components:   Glucose-Capillary 52 (*)    All other components within normal limits  CBG MONITORING, ED - Abnormal; Notable for the following components:   Glucose-Capillary 39 (*)    All other components within normal limits  CBG MONITORING, ED - Abnormal; Notable for the following  components:   Glucose-Capillary 59 (*)    All other components within normal limits  SARS CORONAVIRUS 2 (TAT 6-24 HRS)  HEMOGLOBIN AND HEMATOCRIT, BLOOD  HEMOGLOBIN AND HEMATOCRIT, BLOOD  HEMOGLOBIN AND HEMATOCRIT, BLOOD  HEMOGLOBIN AND HEMATOCRIT, BLOOD  TYPE AND SCREEN  SAMPLE TO BLOOD BANK  ABO/RH    EKG None  Radiology CT ABDOMEN PELVIS W CONTRAST  Result Date: 11/25/2020 CLINICAL DATA:  Bright red blood per rectum, lower abdominal pain, multiple bowel movements EXAM: CT ABDOMEN AND PELVIS WITH CONTRAST TECHNIQUE: Multidetector CT imaging of the abdomen and pelvis was performed using the standard protocol following bolus administration of intravenous contrast. CONTRAST:  OMNIPAQUE  IOHEXOL 300 MG/ML  SOLN COMPARISON:  None. FINDINGS: Lower chest: Lung bases are clear. Normal heart size. No pericardial effusion. Hepatobiliary: Diffuse hepatic hypoattenuation compatible with hepatic steatosis. Smooth liver surface contour. No concerning focal liver lesion. Gallbladder partly decompressed. No visible calcified gallstone or biliary ductal dilatation. No pericholecystic inflammation. Pancreas: No pancreatic ductal dilatation or surrounding inflammatory changes. Spleen: Normal in size. No concerning splenic lesions. Small accessory splenule towards the hilum. Adrenals/Urinary Tract: Kidneys are normally located with symmetric enhancement and excretion. No suspicious renal lesion, urolithiasis or hydronephrosis. Urinary bladder is largely decompressed at the time of exam and therefore poorly evaluated by CT imaging. Mild bladder wall thickening may be related to underdistention without other gross bladder abnormality. Stomach/Bowel: Distal esophagus, stomach and duodenum are unremarkable. No small bowel thickening or dilatation. Normal appendix without inflammation in the right lower quadrant. Proximal colon is unremarkable. There is a focally thickened segment of colon at the splenic flexure  with very faint surrounding hazy mesenteric change and increased vascularity. A more normal appearance of the distal colonic segments. No evidence of obstruction. Vascular/Lymphatic: No significant vascular findings are present. No enlarged abdominal or pelvic lymph nodes. Reproductive: The prostate and seminal vesicles are unremarkable. Other: Small bilateral fat containing inguinal hernias. No bowel containing hernia. No abdominopelvic free air or fluid. Musculoskeletal: No acute osseous abnormality or suspicious osseous lesion. Prior posterior decompressive changes in interbody fusion L3-S1. IMPRESSION: 1. Focal segment of edematous colonic thickening noted at the splenic flexure with some hazy surrounding mesenteric change. No resulting obstruction. While this could reflect a focal colitis, an underlying lesion is not fully excluded and finding should prompt further evaluation with direct visualization. 2. No significant diverticular disease. 3. Mild bladder wall thickening, likely related to underdistention. Electronically Signed   By: Kreg Shropshire M.D.   On: 11/25/2020 22:55    Procedures Procedures   Medications Ordered in ED Medications  sodium chloride (PF) 0.9 % injection (has no administration in time range)  metroNIDAZOLE (FLAGYL) tablet 500 mg (500 mg Oral Given 11/26/20 0024)  sodium chloride 0.9 % bolus 1,000 mL (1,000 mLs Intravenous Bolus from Bag 11/25/20 2312)  iohexol (OMNIPAQUE) 300 MG/ML solution 100 mL (100 mLs Intravenous Contrast Given 11/25/20 2239)  ciprofloxacin (CIPRO) IVPB 400 mg (400 mg Intravenous New Bag/Given 11/26/20 0031)  dextrose 50 % solution 25 mL (25 mLs Intravenous Given 11/26/20 0001)    ED Course  I have reviewed the triage vital signs and the nursing notes.  Pertinent labs & imaging results that were available during my care of the patient were reviewed by me and considered in my medical decision making (see chart for details).  Clinical Course as of  11/26/20 0206  Wed Nov 25, 2020  2345 D50 ordered for hypoglycemia. Patient just reassessed and is mentating well. Was given PO fluids/juice by staffing which he has tolerated. [KH]  2358 CBG 59. RN given OK for 29mL D50. [KH]  Thu Nov 26, 2020  0031 Dr. Rachael Darby to assess patient in the ED for admission. Secure chat message sent to Dr. Bosie Clos of [facility] for request of AM consultation. [KH]    Clinical Course User Index [KH] Antony Madura, PA-C   MDM Rules/Calculators/A&P                          60 year old male presents to the emergency department for lower abdominal pain and bright red blood per rectum.  His CT is suggestive  of colitis, though unable to exclude colonic lesion.  Currently hemodynamically stable with reassuring hemoglobin.  Is not chronically anticoagulated.  The patient was started on antibiotics for management of presumed colitis pending GI consultation in the morning.  Hospitalist service to admit for further management, CBC trending.   Final Clinical Impression(s) / ED Diagnoses Final diagnoses:  Colitis  Hematochezia  Hypoglycemia    Rx / DC Orders ED Discharge Orders     None        Antony Madura, PA-C 11/26/20 0207    Sabas Sous, MD 11/26/20 [phone]

## 2020-11-25 NOTE — ED Triage Notes (Addendum)
Patient states he had lower abdominal pain at 3am this morning. He then had a couple Bms that he noticed were blood tinged. Then at 11am he a large BM that was nothing but blood.    7/10 pain  Denies N/v or urinary symptoms.

## 2020-11-25 NOTE — ED Notes (Signed)
Pt given apple juice  

## 2020-11-26 ENCOUNTER — Encounter (HOSPITAL_COMMUNITY): Payer: Self-pay | Admitting: Family Medicine

## 2020-11-26 ENCOUNTER — Other Ambulatory Visit: Payer: Self-pay

## 2020-11-26 DIAGNOSIS — K529 Noninfective gastroenteritis and colitis, unspecified: Secondary | ICD-10-CM | POA: Diagnosis present

## 2020-11-26 DIAGNOSIS — Z79899 Other long term (current) drug therapy: Secondary | ICD-10-CM | POA: Diagnosis not present

## 2020-11-26 DIAGNOSIS — Z20822 Contact with and (suspected) exposure to covid-19: Secondary | ICD-10-CM | POA: Diagnosis present

## 2020-11-26 DIAGNOSIS — K625 Hemorrhage of anus and rectum: Secondary | ICD-10-CM

## 2020-11-26 DIAGNOSIS — K4020 Bilateral inguinal hernia, without obstruction or gangrene, not specified as recurrent: Secondary | ICD-10-CM | POA: Diagnosis present

## 2020-11-26 DIAGNOSIS — K559 Vascular disorder of intestine, unspecified: Secondary | ICD-10-CM | POA: Diagnosis present

## 2020-11-26 DIAGNOSIS — E162 Hypoglycemia, unspecified: Secondary | ICD-10-CM | POA: Diagnosis present

## 2020-11-26 DIAGNOSIS — Q8909 Congenital malformations of spleen: Secondary | ICD-10-CM | POA: Diagnosis not present

## 2020-11-26 DIAGNOSIS — K760 Fatty (change of) liver, not elsewhere classified: Secondary | ICD-10-CM | POA: Diagnosis present

## 2020-11-26 DIAGNOSIS — G8929 Other chronic pain: Secondary | ICD-10-CM | POA: Diagnosis present

## 2020-11-26 DIAGNOSIS — M549 Dorsalgia, unspecified: Secondary | ICD-10-CM | POA: Diagnosis present

## 2020-11-26 DIAGNOSIS — Z888 Allergy status to other drugs, medicaments and biological substances status: Secondary | ICD-10-CM | POA: Diagnosis not present

## 2020-11-26 LAB — APTT: aPTT: 62 seconds — ABNORMAL HIGH (ref 24–36)

## 2020-11-26 LAB — CBC WITH DIFFERENTIAL/PLATELET
Abs Immature Granulocytes: 0.05 10*3/uL (ref 0.00–0.07)
Abs Immature Granulocytes: 0.06 10*3/uL (ref 0.00–0.07)
Basophils Absolute: 0.1 10*3/uL (ref 0.0–0.1)
Basophils Absolute: 0.1 10*3/uL (ref 0.0–0.1)
Basophils Relative: 1 %
Basophils Relative: 1 %
Eosinophils Absolute: 0.1 10*3/uL (ref 0.0–0.5)
Eosinophils Absolute: 0.1 10*3/uL (ref 0.0–0.5)
Eosinophils Relative: 1 %
Eosinophils Relative: 1 %
HCT: 44.1 % (ref 39.0–52.0)
HCT: 49 % (ref 39.0–52.0)
Hemoglobin: 15.8 g/dL (ref 13.0–17.0)
Hemoglobin: 17.5 g/dL — ABNORMAL HIGH (ref 13.0–17.0)
Immature Granulocytes: 1 %
Immature Granulocytes: 1 %
Lymphocytes Relative: 28 %
Lymphocytes Relative: 35 %
Lymphs Abs: 2.4 10*3/uL (ref 0.7–4.0)
Lymphs Abs: 2.7 10*3/uL (ref 0.7–4.0)
MCH: 32.7 pg (ref 26.0–34.0)
MCH: 32.5 pg (ref 26.0–34.0)
MCHC: 35.8 g/dL (ref 30.0–36.0)
MCHC: 35.7 g/dL (ref 30.0–36.0)
MCV: 91.3 fL (ref 80.0–100.0)
MCV: 90.9 fL (ref 80.0–100.0)
Monocytes Absolute: 0.6 10*3/uL (ref 0.1–1.0)
Monocytes Absolute: 0.6 10*3/uL (ref 0.1–1.0)
Monocytes Relative: 7 %
Monocytes Relative: 8 %
Neutro Abs: 5.6 10*3/uL (ref 1.7–7.7)
Neutro Abs: 4.3 10*3/uL (ref 1.7–7.7)
Neutrophils Relative %: 62 %
Neutrophils Relative %: 54 %
Platelets: 134 10*3/uL — ABNORMAL LOW (ref 150–400)
Platelets: 128 10*3/uL — ABNORMAL LOW (ref 150–400)
RBC: 4.83 MIL/uL (ref 4.22–5.81)
RBC: 5.39 MIL/uL (ref 4.22–5.81)
RDW: 12.6 % (ref 11.5–15.5)
RDW: 12.7 % (ref 11.5–15.5)
WBC: 8.8 10*3/uL (ref 4.0–10.5)
WBC: 7.9 10*3/uL (ref 4.0–10.5)
nRBC: 0 % (ref 0.0–0.2)
nRBC: 0.5 % — ABNORMAL HIGH (ref 0.0–0.2)

## 2020-11-26 LAB — SARS CORONAVIRUS 2 (TAT 6-24 HRS): SARS Coronavirus 2: NEGATIVE

## 2020-11-26 LAB — BASIC METABOLIC PANEL
Anion gap: 10 (ref 5–15)
BUN: 15 mg/dL (ref 6–20)
CO2: 26 mmol/L (ref 22–32)
Calcium: 9.1 mg/dL (ref 8.9–10.3)
Chloride: 104 mmol/L (ref 98–111)
Creatinine, Ser: 1.27 mg/dL — ABNORMAL HIGH (ref 0.61–1.24)
GFR, Estimated: >60 mL/min (ref >60)
Glucose, Bld: 99 mg/dL (ref 70–99)
Potassium: 4 mmol/L (ref 3.5–5.1)
Sodium: 140 mmol/L (ref 135–145)

## 2020-11-26 LAB — CBC
HCT: 43 % (ref 39.0–52.0)
Hemoglobin: 15.1 g/dL (ref 13.0–17.0)
MCH: 32.4 pg (ref 26.0–34.0)
MCHC: 35.1 g/dL (ref 30.0–36.0)
MCV: 92.3 fL (ref 80.0–100.0)
Platelets: 143 10*3/uL — ABNORMAL LOW (ref 150–400)
RBC: 4.66 MIL/uL (ref 4.22–5.81)
RDW: 12.7 % (ref 11.5–15.5)
WBC: 9.2 10*3/uL (ref 4.0–10.5)
nRBC: 0 % (ref 0.0–0.2)

## 2020-11-26 LAB — TYPE AND SCREEN
ABO/RH(D): A NEG
Antibody Screen: NEGATIVE

## 2020-11-26 LAB — PROTIME-INR
INR: 2.9 — ABNORMAL HIGH (ref 0.8–1.2)
Prothrombin Time: 30 seconds — ABNORMAL HIGH (ref 11.4–15.2)

## 2020-11-26 LAB — CBG MONITORING, ED: Glucose-Capillary: 106 mg/dL — ABNORMAL HIGH (ref 70–99)

## 2020-11-26 LAB — SAMPLE TO BLOOD BANK

## 2020-11-26 LAB — HEMOGLOBIN AND HEMATOCRIT, BLOOD
HCT: 41.8 % (ref 39.0–52.0)
Hemoglobin: 14.7 g/dL (ref 13.0–17.0)

## 2020-11-26 LAB — HIV ANTIBODY (ROUTINE TESTING W REFLEX): HIV Screen 4th Generation wRfx: NONREACTIVE

## 2020-11-26 LAB — ABO/RH: ABO/RH(D): A NEG

## 2020-11-26 MED ORDER — TIZANIDINE HCL 4 MG PO TABS: 4.0000 mg | ORAL_TABLET | Freq: Every day | ORAL | Status: DC

## 2020-11-26 MED ORDER — LACTATED RINGERS IV SOLN: INTRAVENOUS | Status: DC

## 2020-11-26 MED ORDER — MORPHINE SULFATE (PF) 2 MG/ML IV SOLN
2.0000 mg | INTRAVENOUS | Status: DC | PRN
Start: 2020-11-26 — End: 2020-11-27
  Administered 2020-11-26: 2 mg via INTRAVENOUS
  Filled 2020-11-26: qty 1

## 2020-11-26 MED ORDER — CIPROFLOXACIN IN D5W 400 MG/200ML IV SOLN: 400.0000 mg | Freq: Two times a day (BID) | INTRAVENOUS | Status: DC

## 2020-11-26 MED ORDER — ONDANSETRON HCL 4 MG/2ML IJ SOLN: 4.0000 mg | Freq: Four times a day (QID) | INTRAMUSCULAR | Status: DC | PRN

## 2020-11-26 MED ORDER — PANTOPRAZOLE SODIUM 40 MG PO TBEC
40.0000 mg | DELAYED_RELEASE_TABLET | Freq: Every day | ORAL | Status: DC
  Administered 2020-11-26 – 2020-11-27 (×2): 40 mg via ORAL
  Filled 2020-11-26 (×2): qty 1

## 2020-11-26 MED ORDER — ROSUVASTATIN CALCIUM 10 MG PO TABS
10.0000 mg | ORAL_TABLET | Freq: Every day | ORAL | Status: DC
  Administered 2020-11-26: 10 mg via ORAL
  Filled 2020-11-26: qty 1

## 2020-11-26 MED ORDER — ELETRIPTAN HYDROBROMIDE 20 MG PO TABS
20.0000 mg | ORAL_TABLET | Freq: Once | ORAL | Status: AC
  Administered 2020-11-26: 20 mg via ORAL
  Filled 2020-11-26: qty 1

## 2020-11-26 MED ORDER — ONDANSETRON HCL 4 MG PO TABS: 4.0000 mg | ORAL_TABLET | Freq: Four times a day (QID) | ORAL | Status: DC | PRN

## 2020-11-26 MED ORDER — METRONIDAZOLE 500 MG PO TABS
500.0000 mg | ORAL_TABLET | Freq: Three times a day (TID) | ORAL | Status: DC
  Administered 2020-11-26 – 2020-11-27 (×4): 500 mg via ORAL
  Filled 2020-11-26 (×4): qty 1

## 2020-11-26 MED ORDER — ACYCLOVIR 400 MG PO TABS
400.0000 mg | ORAL_TABLET | Freq: Two times a day (BID) | ORAL | Status: DC
  Administered 2020-11-26 – 2020-11-27 (×3): 400 mg via ORAL
  Filled 2020-11-26 (×3): qty 1

## 2020-11-26 MED ORDER — ACETAMINOPHEN 325 MG PO TABS
650.0000 mg | ORAL_TABLET | Freq: Four times a day (QID) | ORAL | Status: DC | PRN
  Administered 2020-11-26: 650 mg via ORAL
  Filled 2020-11-26: qty 2

## 2020-11-26 MED ORDER — CYCLOBENZAPRINE HCL 10 MG PO TABS
10.0000 mg | ORAL_TABLET | Freq: Two times a day (BID) | ORAL | Status: DC
  Administered 2020-11-26 – 2020-11-27 (×3): 10 mg via ORAL
  Filled 2020-11-26 (×3): qty 1

## 2020-11-26 MED ORDER — DULOXETINE HCL 20 MG PO CPEP
20.0000 mg | ORAL_CAPSULE | Freq: Two times a day (BID) | ORAL | Status: DC
  Administered 2020-11-26 – 2020-11-27 (×3): 20 mg via ORAL
  Filled 2020-11-26 (×3): qty 1

## 2020-11-26 MED ORDER — ACETAMINOPHEN 650 MG RE SUPP: 650.0000 mg | Freq: Four times a day (QID) | RECTAL | Status: DC | PRN

## 2020-11-26 MED ORDER — METRONIDAZOLE 500 MG/100ML IV SOLN: 500.0000 mg | Freq: Three times a day (TID) | INTRAVENOUS | Status: DC

## 2020-11-26 MED ORDER — CIPROFLOXACIN IN D5W 400 MG/200ML IV SOLN
400.0000 mg | Freq: Two times a day (BID) | INTRAVENOUS | Status: DC
  Administered 2020-11-26 (×2): 400 mg via INTRAVENOUS
  Filled 2020-11-26 (×2): qty 200

## 2020-11-26 NOTE — ED Notes (Signed)
ED TO INPATIENT HANDOFF REPORT  Name/Age/Gender Dustin Pearson 60 y.o. male  Code Status    Code Status Orders  (From admission, onward)         Start     Ordered   11/26/20 0629  Full code  Continuous        11/26/20 0628        Code Status History    This patient has a current code status but no historical code status.      Home/SNF/Other Home  Chief Complaint Colitis [K52.9]  Level of Care/Admitting Diagnosis ED Disposition    ED Disposition  Admit   Condition  --   Comment  Hospital Area: [facility] [[id number]]  Level of Care: Med-Surg [16]  May admit patient to [facility] or [facility] if equivalent level of care is available:: Yes  Covid Evaluation: Asymptomatic Screening Protocol (No Symptoms)  Diagnosis: Colitis [852778]  Admitting Physician: Carlton Adam [[id number]]  Attending Physician: Carlton Adam [[id number]]  Estimated length of stay: past midnight tomorrow  Certification:: I certify this patient will need inpatient services for at least 2 midnights         Medical History History reviewed. No pertinent past medical history.  Allergies Allergies  Allergen Reactions  . Gabapentin     Other reaction(s): Other (See Comments) Dry mouth    IV Location/Drains/Wounds Patient Lines/Drains/Airways Status    Active Line/Drains/Airways    Name Placement date Placement time Site Days   Peripheral IV 11/25/20 22 G Left Antecubital 11/25/20  2234  Antecubital  1          Labs/Imaging Results for orders placed or performed during the hospital encounter of 11/25/20 (from the past 48 hour(s))  Comprehensive metabolic panel     Status: Abnormal   Collection Time: 11/25/20  6:12 PM  Result Value Ref Range   Sodium 140 135 - 145 mmol/L   Potassium 3.4 (L) 3.5 - 5.1 mmol/L   Chloride 107 98 - 111 mmol/L   CO2 25 22 - 32 mmol/L   Glucose, Bld 68 (L) 70 - 99 mg/dL    Comment: Glucose reference range applies only to  samples taken after fasting for at least 8 hours.   BUN 16 6 - 20 mg/dL   Creatinine, Ser 0.08 (H) 0.61 - 1.24 mg/dL   Calcium 9.4 8.9 - 67.6 mg/dL   Total Protein 7.8 6.5 - 8.1 g/dL   Albumin 4.7 3.5 - 5.0 g/dL   AST 29 15 - 41 U/L   ALT 25 0 - 44 U/L   Alkaline Phosphatase 76 38 - 126 U/L   Total Bilirubin 0.7 0.3 - 1.2 mg/dL   GFR, Estimated >19 >50 mL/min    Comment: (NOTE) Calculated using the CKD-EPI Creatinine Equation (2021)    Anion gap 8 5 - 15    Comment: Performed at [facility], [address], [place], [place] [address]  CBC     Status: Abnormal   Collection Time: 11/25/20  6:12 PM  Result Value Ref Range   WBC 10.8 (H) 4.0 - 10.5 K/uL   RBC 4.93 4.22 - 5.81 MIL/uL   Hemoglobin 15.9 13.0 - 17.0 g/dL   HCT 12.4 58.0 - 99.8 %   MCV 93.1 80.0 - 100.0 fL   MCH 32.3 26.0 - 34.0 pg   MCHC 34.6 30.0 - 36.0 g/dL   RDW 33.8 25.0 - 53.9 %   Platelets 176 150 -  400 K/uL   nRBC 0.0 0.0 - 0.2 %    Comment: Performed at [facility], [address] [place], [place] [address]  Type and screen [facility]     Status: None   Collection Time: 11/25/20 10:30 PM  Result Value Ref Range   ABO/RH(D) A NEG    Antibody Screen NEG    Sample Expiration      11/28/2020,2359 Performed at [facility], [address] [place], [place] [address]   POC occult blood, ED     Status: Abnormal   Collection Time: 11/25/20 10:53 PM  Result Value Ref Range   Fecal Occult Bld POSITIVE (A) NEGATIVE  CBG monitoring, ED     Status: Abnormal   Collection Time: 11/25/20 10:58 PM  Result Value Ref Range   Glucose-Capillary 52 (L) 70 - 99 mg/dL    Comment: Glucose reference range applies only to samples taken after fasting for at least 8 hours.  CBG monitoring, ED     Status: Abnormal   Collection Time: 11/25/20 11:30 PM  Result Value Ref Range   Glucose-Capillary 39 (LL) 70 - 99 mg/dL    Comment: Glucose reference  range applies only to samples taken after fasting for at least 8 hours.  Sample to Blood Bank     Status: None   Collection Time: 11/25/20 11:47 PM  Result Value Ref Range   Blood Bank Specimen SAMPLE AVAILABLE FOR TESTING    Sample Expiration      11/28/2020,2359 Performed at [facility], [address] [place], [place] [address]   CBG monitoring, ED     Status: Abnormal   Collection Time: 11/25/20 11:56 PM  Result Value Ref Range   Glucose-Capillary 59 (L) 70 - 99 mg/dL    Comment: Glucose reference range applies only to samples taken after fasting for at least 8 hours.  Protime-INR     Status: Abnormal   Collection Time: 11/26/20 12:30 AM  Result Value Ref Range   Prothrombin Time 30.0 (H) 11.4 - 15.2 seconds   INR 2.9 (H) 0.8 - 1.2    Comment: (NOTE) INR goal varies based on device and disease states. Performed at [facility], [address] [place], [place] [address]   APTT     Status: Abnormal   Collection Time: 11/26/20 12:30 AM  Result Value Ref Range   aPTT 62 (H) 24 - 36 seconds    Comment:        IF BASELINE aPTT IS ELEVATED, SUGGEST PATIENT RISK ASSESSMENT BE USED TO DETERMINE APPROPRIATE ANTICOAGULANT THERAPY. Performed at [facility], [address] [place], [place] [address]   CBG monitoring, ED     Status: Abnormal   Collection Time: 11/26/20  2:59 AM  Result Value Ref Range   Glucose-Capillary 106 (H) 70 - 99 mg/dL    Comment: Glucose reference range applies only to samples taken after fasting for at least 8 hours.   CT ABDOMEN PELVIS W CONTRAST  Result Date: 11/25/2020 CLINICAL DATA:  Bright red blood per rectum, lower abdominal pain, multiple bowel movements EXAM: CT ABDOMEN AND PELVIS WITH CONTRAST TECHNIQUE: Multidetector CT imaging of the abdomen and pelvis was performed using the standard protocol following bolus administration of intravenous contrast. CONTRAST:  100mL OMNIPAQUE IOHEXOL  300 MG/ML  SOLN COMPARISON:  None. FINDINGS: Lower chest: Lung bases are clear. Normal heart size. No pericardial effusion. Hepatobiliary: Diffuse hepatic hypoattenuation compatible with  hepatic steatosis. Smooth liver surface contour. No concerning focal liver lesion. Gallbladder partly decompressed. No visible calcified gallstone or biliary ductal dilatation. No pericholecystic inflammation. Pancreas: No pancreatic ductal dilatation or surrounding inflammatory changes. Spleen: Normal in size. No concerning splenic lesions. Small accessory splenule towards the hilum. Adrenals/Urinary Tract: Kidneys are normally located with symmetric enhancement and excretion. No suspicious renal lesion, urolithiasis or hydronephrosis. Urinary bladder is largely decompressed at the time of exam and therefore poorly evaluated by CT imaging. Mild bladder wall thickening may be related to underdistention without other gross bladder abnormality. Stomach/Bowel: Distal esophagus, stomach and duodenum are unremarkable. No small bowel thickening or dilatation. Normal appendix without inflammation in the right lower quadrant. Proximal colon is unremarkable. There is a focally thickened segment of colon at the splenic flexure with very faint surrounding hazy mesenteric change and increased vascularity. A more normal appearance of the distal colonic segments. No evidence of obstruction. Vascular/Lymphatic: No significant vascular findings are present. No enlarged abdominal or pelvic lymph nodes. Reproductive: The prostate and seminal vesicles are unremarkable. Other: Small bilateral fat containing inguinal hernias. No bowel containing hernia. No abdominopelvic free air or fluid. Musculoskeletal: No acute osseous abnormality or suspicious osseous lesion. Prior posterior decompressive changes in interbody fusion L3-S1. IMPRESSION: 1. Focal segment of edematous colonic thickening noted at the splenic flexure with some hazy surrounding mesenteric  change. No resulting obstruction. While this could reflect a focal colitis, an underlying lesion is not fully excluded and finding should prompt further evaluation with direct visualization. 2. No significant diverticular disease. 3. Mild bladder wall thickening, likely related to underdistention. Electronically Signed   By: Kreg Shropshire M.D.   On: 11/25/2020 22:55    Pending Labs Unresulted Labs (From admission, onward)    Start     Ordered   11/26/20 1610  HIV Antibody (routine testing w rflx)  (HIV Antibody (Routine testing w reflex) panel)  Once,   STAT        11/26/20 0628   11/26/20 0629  Basic metabolic panel  Tomorrow morning,   R        11/26/20 0628   11/26/20 0629  CBC  Tomorrow morning,   R        11/26/20 0628   11/26/20 0100  ABO/Rh  Once,   R        11/26/20 0100   11/26/20 0025  Hemoglobin and hematocrit, blood  Now then every 6 hours,   R (with STAT occurrences)      11/26/20 0024   11/25/20 2348  SARS CORONAVIRUS 2 (TAT 6-24 HRS) Nasopharyngeal Nasopharyngeal Swab  (Tier 3 - Symptomatic/asymptomatic)  Once,   STAT       Question Answer Comment  Is this test for diagnosis or screening Screening   Symptomatic for COVID-19 as defined by CDC No   Hospitalized for COVID-19 No   Admitted to ICU for COVID-19 No   Previously tested for COVID-19 No   Resident in a congregate (group) care setting No   Employed in healthcare setting Unknown   Has patient completed COVID vaccination(s) (2 doses of Pfizer/Moderna 1 dose of [employer]) Unknown      11/25/20 2347          Vitals/Pain Today's Vitals   11/25/20 1842 11/25/20 2257 11/26/20 0000 11/26/20 0505  BP: 134/85 (!) 148/73 (!) 145/88 122/67  Pulse: 74 69 60 (!) 59  Resp: 18 17 18 17   Temp:      SpO2: 100%  100% 100% 100%  PainSc:        Isolation Precautions No active isolations  Medications Medications  sodium chloride (PF) 0.9 % injection (has no administration in time range)  metroNIDAZOLE (FLAGYL)  tablet 500 mg (500 mg Oral Given 11/26/20 0024)  acyclovir (ZOVIRAX) tablet 400 mg (has no administration in time range)  rosuvastatin (CRESTOR) tablet 10 mg (has no administration in time range)  DULoxetine (CYMBALTA) DR capsule 20 mg (has no administration in time range)  pantoprazole (PROTONIX) EC tablet 40 mg (has no administration in time range)  cyclobenzaprine (FLEXERIL) tablet 10 mg (has no administration in time range)  lactated ringers infusion (has no administration in time range)  acetaminophen (TYLENOL) tablet 650 mg (has no administration in time range)    Or  acetaminophen (TYLENOL) suppository 650 mg (has no administration in time range)  ondansetron (ZOFRAN) tablet 4 mg (has no administration in time range)    Or  ondansetron (ZOFRAN) injection 4 mg (has no administration in time range)  ciprofloxacin (CIPRO) IVPB 400 mg (has no administration in time range)  sodium chloride 0.9 % bolus 1,000 mL (0 mLs Intravenous Stopped 11/26/20 0030)  iohexol (OMNIPAQUE) 300 MG/ML solution 100 mL (100 mLs Intravenous Contrast Given 11/25/20 2239)  ciprofloxacin (CIPRO) IVPB 400 mg (0 mg Intravenous Stopped 11/26/20 0131)  dextrose 50 % solution 25 mL (25 mLs Intravenous Given 11/26/20 0001)    Mobility walks

## 2020-11-26 NOTE — Progress Notes (Signed)
This is a pleasant 60 year old gentleman with chronic back pain who presented with abdominal pain and was eventually diagnosed with acute colitis at the splenic flexure.  He initially developed abdominal pain followed by diarrhea followed by rectal bleeding.  His symptoms are very typical of possible ischemic colitis.  He has not had any further rectal bleeding since 2 AM this morning.  No more abdominal pain.  On examination, lungs clear to auscultation, abdomen completely benign as well.  Hemoglobin stable.  Hypokalemia that he had yesterday has resolved.  He also has elevated creatinine, not sure whether this is acute kidney injury or chronic kidney disease as we do not have any previous records.  We have consulted GI.  He will likely benefit from colonoscopy.  Will defer to GI about timing and further decisions.  His hemoglobin is a stable.  No need to check hemoglobin every 6 hours.  We will switch to every 12 hours.  We will continue ciprofloxacin and Flagyl.  We will leave on clear liquid diet and defer to GI about that.

## 2020-11-26 NOTE — Consult Note (Signed)
[facility] Consultation Note  Referring Provider: No ref. provider found Primary Care Physician:  Verl Bangs, MD Primary Gastroenterologist:  Dr. Yevonne Pax Kathryne Sharper)  Reason for Consultation:  Hematochezia, abdominal pain  HPI: Dustin Pearson is a 60 y.o. male whom I've been asked to see for above reasons.  Early hours this morning patient had acute onset of generalized crampy abdominal pain followed by several episodes of hematochezia.  No prior episodes.  Feeling better now, but symptoms haven't resolved.  Had colonoscopy with Dr. Yevonne Pax about one year ago (small polyp otherwise normal per patient; I can't locate the copy of the report in care everywhere).     History reviewed. No pertinent past medical history.  Past Surgical History:  Procedure Laterality Date   BACK SURGERY      Prior to Admission medications   Medication Sig Start Date End Date Taking? Authorizing Provider  acyclovir (ZOVIRAX) 400 MG tablet Take 1 tablet by mouth 2 (two) times daily. 08/14/15  Yes [provider]  cyclobenzaprine (FLEXERIL) 10 MG tablet Take 10 mg by mouth 2 (two) times daily.   Yes [provider]  DULoxetine (CYMBALTA) 20 MG capsule Take 20 mg by mouth 2 (two) times daily. 04/27/20  Yes [provider]  Multiple Vitamin (MULTIVITAMIN WITH MINERALS) TABS tablet Take 1 tablet by mouth daily.   Yes [provider]  omeprazole (PRILOSEC) 20 MG capsule Take 20 mg by mouth daily. 09/28/20  Yes [provider]  rosuvastatin (CRESTOR) 10 MG tablet Take 10 mg by mouth at bedtime. 09/28/20  Yes [provider]  tiZANidine (ZANAFLEX) 4 MG tablet Take 4 mg by mouth at bedtime. 10/21/20  Yes [provider]  amoxicillin-clavulanate (AUGMENTIN) 875-125 MG tablet Take 1 tablet by mouth 2 (two) times daily. One po bid x 7 days Patient not taking: Reported on 11/25/2020 10/18/18   Lurene Shadow, PA-C    Current  Facility-Administered Medications  Medication Dose Route Frequency Provider Last Rate Last Admin   acetaminophen (TYLENOL) tablet 650 mg  650 mg Oral Q6H PRN Chotiner, Claudean Severance, MD   650 mg at 11/26/20 1000   Or   acetaminophen (TYLENOL) suppository 650 mg  650 mg Rectal Q6H PRN Chotiner, Claudean Severance, MD       acyclovir (ZOVIRAX) tablet 400 mg  400 mg Oral BID Chotiner, Claudean Severance, MD   400 mg at 11/26/20 0102   ciprofloxacin (CIPRO) IVPB 400 mg  400 mg Intravenous Q12H Hughie Closs, MD       cyclobenzaprine (FLEXERIL) tablet 10 mg  10 mg Oral BID Chotiner, Claudean Severance, MD   10 mg at 11/26/20 0815   DULoxetine (CYMBALTA) DR capsule 20 mg  20 mg Oral BID Chotiner, Claudean Severance, MD   20 mg at 11/26/20 7253   lactated ringers infusion   Intravenous Continuous Chotiner, Claudean Severance, MD 125 mL/hr at 11/26/20 0822 New Bag at 11/26/20 6644   metroNIDAZOLE (FLAGYL) tablet 500 mg  500 mg Oral Q8H Chotiner, Claudean Severance, MD   500 mg at 11/26/20 0816   ondansetron (ZOFRAN) tablet 4 mg  4 mg Oral Q6H PRN Chotiner, Claudean Severance, MD       Or   ondansetron (ZOFRAN) injection 4 mg  4 mg Intravenous Q6H PRN Chotiner, Claudean Severance, MD       pantoprazole (PROTONIX) EC tablet 40 mg  40 mg Oral Daily Chotiner, Claudean Severance, MD   40 mg at 11/26/20 0816   rosuvastatin (CRESTOR) tablet  10 mg  10 mg Oral QHS Chotiner, Claudean Severance, MD        Allergies as of 11/25/2020 - Review Complete 11/25/2020  Allergen Reaction Noted   Gabapentin  12/10/2019    Family History  Problem Relation Age of Onset   Healthy Mother    Healthy Father     Social History   Socioeconomic History   Marital status: Married    Spouse name: Not on file   Number of children: Not on file   Years of education: Not on file   Highest education level: Not on file  Occupational History   Not on file  Tobacco Use   Smoking status: Never   Smokeless tobacco: Never  Vaping Use   Vaping Use: Never used  Substance and Sexual Activity   Alcohol use: Not  Currently   Drug use: Not Currently   Sexual activity: Not on file  Other Topics Concern   Not on file  Social History Narrative   Not on file   Social Determinants of Health   Financial Resource Strain: Not on file  Food Insecurity: Not on file  Transportation Needs: Not on file  Physical Activity: Not on file  Stress: Not on file  Social Connections: Not on file  Intimate Partner Violence: Not on file    Review of Systems: As per HPI, all others negative  Physical Exam: Vital signs in last 24 hours: Temp:  [97.6 F (36.4 C)-97.8 F (36.6 C)] 97.8 F (36.6 C) (06/30 0912) Pulse Rate:  [59-84] 59 (06/30 0912) Resp:  [16-19] 19 (06/30 0912) BP: (122-148)/(61-105) 141/93 (06/30 0912) SpO2:  [100 %] 100 % (06/30 0912) Weight:  [101.6 kg] 101.6 kg (06/30 0505) Last BM Date: 11/26/20 General:   Alert,  Well-developed, well-nourished, pleasant and cooperative in NAD Head:  Normocephalic and atraumatic. Eyes:  Sclera clear, no icterus.   Conjunctiva pink. Ears:  Normal auditory acuity. Nose:  No deformity, discharge,  or lesions. Mouth:  No deformity or lesions.  Oropharynx pink & moist. Neck:  Supple; no masses or thyromegaly. Abdomen:  Soft, mild lower abdominal tenderness, nondistended. No masses, hepatosplenomegaly or hernias noted. Normal bowel sounds, without guarding, and without rebound.     Msk:  Symmetrical without gross deformities. Normal posture. Pulses:  Normal pulses noted. Extremities:  Without clubbing or edema. Neurologic:  Alert and  oriented x4;  grossly normal neurologically. Skin:  Intact without significant lesions or rashes. Psych:  Alert and cooperative. Normal mood and affect.   Lab Results: Recent Labs    11/25/20 1812 11/26/20 0625 11/26/20 0756  WBC 10.8*  --  9.2  HGB 15.9 14.7 15.1  HCT 45.9 41.8 43.0  PLT 176  --  143*   BMET Recent Labs    11/25/20 1812 11/26/20 0756  NA 140 140  K 3.4* 4.0  CL 107 104  CO2 25 26  GLUCOSE  68* 99  BUN 16 15  CREATININE 1.35* 1.27*  CALCIUM 9.4 9.1   LFT Recent Labs    11/25/20 1812  PROT 7.8  ALBUMIN 4.7  AST 29  ALT 25  ALKPHOS 76  BILITOT 0.7   PT/INR Recent Labs    11/26/20 0030  LABPROT 30.0*  INR 2.9*    Studies/Results: CT ABDOMEN PELVIS W CONTRAST  Result Date: 11/25/2020 CLINICAL DATA:  Bright red blood per rectum, lower abdominal pain, multiple bowel movements EXAM: CT ABDOMEN AND PELVIS WITH CONTRAST TECHNIQUE: Multidetector CT imaging of the abdomen and  pelvis was performed using the standard protocol following bolus administration of intravenous contrast. CONTRAST:  OMNIPAQUE IOHEXOL 300 MG/ML  SOLN COMPARISON:  None. FINDINGS: Lower chest: Lung bases are clear. Normal heart size. No pericardial effusion. Hepatobiliary: Diffuse hepatic hypoattenuation compatible with hepatic steatosis. Smooth liver surface contour. No concerning focal liver lesion. Gallbladder partly decompressed. No visible calcified gallstone or biliary ductal dilatation. No pericholecystic inflammation. Pancreas: No pancreatic ductal dilatation or surrounding inflammatory changes. Spleen: Normal in size. No concerning splenic lesions. Small accessory splenule towards the hilum. Adrenals/Urinary Tract: Kidneys are normally located with symmetric enhancement and excretion. No suspicious renal lesion, urolithiasis or hydronephrosis. Urinary bladder is largely decompressed at the time of exam and therefore poorly evaluated by CT imaging. Mild bladder wall thickening may be related to underdistention without other gross bladder abnormality. Stomach/Bowel: Distal esophagus, stomach and duodenum are unremarkable. No small bowel thickening or dilatation. Normal appendix without inflammation in the right lower quadrant. Proximal colon is unremarkable. There is a focally thickened segment of colon at the splenic flexure with very faint surrounding hazy mesenteric change and increased vascularity.  A more normal appearance of the distal colonic segments. No evidence of obstruction. Vascular/Lymphatic: No significant vascular findings are present. No enlarged abdominal or pelvic lymph nodes. Reproductive: The prostate and seminal vesicles are unremarkable. Other: Small bilateral fat containing inguinal hernias. No bowel containing hernia. No abdominopelvic free air or fluid. Musculoskeletal: No acute osseous abnormality or suspicious osseous lesion. Prior posterior decompressive changes in interbody fusion L3-S1. IMPRESSION: 1. Focal segment of edematous colonic thickening noted at the splenic flexure with some hazy surrounding mesenteric change. No resulting obstruction. While this could reflect a focal colitis, an underlying lesion is not fully excluded and finding should prompt further evaluation with direct visualization. 2. No significant diverticular disease. 3. Mild bladder wall thickening, likely related to underdistention. Electronically Signed   By: Kreg Shropshire M.D.   On: 11/25/2020 22:55    Impression:  Abdominal pain. Splenic flexure colitis. Hematochezia. Elevated INR.  No anticoagulants.  Lab error? Overall constellation of symptoms most typical of ischemic colitis.  Plan:   Recheck INR. Medical management for presumed ischemic colitis. If symptoms worsen, repeat colonoscopy as inpatient; if symptoms continue to improve, would have patient follow-up with Dr. Yevonne Pax who can decide whether or not repeat imaging or colonoscopy is merited. Advance diet as tolerated. [facility] will follow.    LOS: 0 days   Liisa Picone M  11/26/2020, 12:43 PM  Cell [phone] If no answer or after 5 PM call [phone]

## 2020-11-26 NOTE — H&P (Signed)
History and Physical    Dustin Pearson CZY:[id number] DOB: 1960-11-04 DOA: 11/25/2020  PCP: Verl Bangs, MD   Patient coming from: Home  Chief Complaint: Blood per rectum, abdominal pain  HPI: Dustin Pearson is a 60 y.o. male with medical history significant for DDD and chronic back pain who presents for evaluation left lower quadrant abdominal pain that began at 3 AM yesterday morning.  Around 7 AM he states he started to have some diarrhea and around 11 AM he had bright red blood for bowel movements.  He continued to have episodes of bright red blood per rectum every few hours with the last one being around 5 PM yesterday after he arrived at the waiting room of the emergency room.  He reports he had a normal colonoscopy a year ago at [facility].  He has no history of abdominal surgeries.  He denies any abdominal injury or trauma.  He has not had any recent travel or antibiotic use.  He denies any change in his diet or eating undercooked or raw meats.  He does take NSAIDs occasionally for his chronic back pain but he has not had any in the last 2 weeks.  He reports no history of peptic ulcer disease.  He has no history of ulcerative colitis or Crohn's disease.  He reports the pain was in the suprapubic and left lower abdomen and did not radiate.  He has no abdominal pain at this time.  He denies any fever, nausea, vomiting, urinary symptoms, melena. Lives with his wife.  Denies tobacco or illicit drug use.  Drinks alcohol socially but none in the last few weeks.  ED Course: Mr. Depuy has been hemodynamically stable in the emergency room.  He was found to have Hemoccult positive stool.  Initial hemoglobin was 15.  Blood work was unremarkable.  Hospitalist service was asked to admit for further management  Review of Systems:  General: Denies weakness, fever, chills, weight loss, night sweats.  Denies dizziness.  Denies change in appetite HENT: Denies head trauma, headache, denies change in hearing,  tinnitus.  Denies nasal congestion or bleeding.  Denies sore throat, sores in mouth.  Denies difficulty swallowing Eyes: Denies blurry vision, pain in eye, drainage.  Denies discoloration of eyes. Neck: Denies pain.  Denies swelling.  Denies pain with movement. Cardiovascular: Denies chest pain, palpitations.  Denies edema.  Denies orthopnea Respiratory: Denies shortness of breath, cough.  Denies wheezing.  Denies sputum production Gastrointestinal: Reports lower abdominal pain. Has bright red blood per rectum.  Denies nausea, vomiting, diarrhea.  Denies melena.  Denies hematemesis. Musculoskeletal: Denies limitation of movement.  Denies deformity or swelling.  Denies pain.  Denies arthralgias or myalgias. Genitourinary: Denies pelvic pain.  Denies urinary frequency or hesitancy.  Denies dysuria.  Skin: Denies rash.  Denies petechiae, purpura, ecchymosis. Neurological: Denies headache.  Denies syncope.  Denies seizure activity.  Denies weakness or paresthesia.  Denies slurred speech, drooping face.  Denies visual change. Psychiatric: Denies depression, anxiety. Denies hallucinations.  History reviewed. No pertinent past medical history.  History reviewed. No pertinent surgical history.  Social History  reports that he has never smoked. He has never used smokeless tobacco. He reports previous alcohol use. He reports previous drug use.  Allergies  Allergen Reactions   Gabapentin     Other reaction(s): Other (See Comments) Dry mouth    Family History  Problem Relation Age of Onset   Healthy Mother    Healthy Father  Prior to Admission medications   Medication Sig Start Date End Date Taking? Authorizing Provider  acyclovir (ZOVIRAX) 400 MG tablet Take 1 tablet by mouth 2 (two) times daily. 08/14/15  Yes [provider]  cyclobenzaprine (FLEXERIL) 10 MG tablet Take 10 mg by mouth 2 (two) times daily.   Yes [provider]  DULoxetine (CYMBALTA) 20 MG capsule Take  20 mg by mouth 2 (two) times daily. 04/27/20  Yes [provider]  Multiple Vitamin (MULTIVITAMIN WITH MINERALS) TABS tablet Take 1 tablet by mouth daily.   Yes [provider]  omeprazole (PRILOSEC) 20 MG capsule Take 20 mg by mouth daily. 09/28/20  Yes [provider]  rosuvastatin (CRESTOR) 10 MG tablet Take 10 mg by mouth at bedtime. 09/28/20  Yes [provider]  tiZANidine (ZANAFLEX) 4 MG tablet Take 4 mg by mouth at bedtime. 10/21/20  Yes [provider]  amoxicillin-clavulanate (AUGMENTIN) 875-125 MG tablet Take 1 tablet by mouth 2 (two) times daily. One po bid x 7 days Patient not taking: Reported on 11/25/2020 10/18/18   Lurene Shadow, [place]    Physical Exam: Vitals:   11/25/20 1731 11/25/20 1842 11/25/20 2257 11/26/20 0000  BP: (!) 136/105 134/85 (!) 148/73 (!) 145/88  Pulse: 84 74 69 60  Resp: 19 18 17 18   Temp: 97.8 F (36.6 C)     SpO2: 100% 100% 100% 100%    Constitutional: NAD, calm, comfortable Vitals:   11/25/20 1731 11/25/20 1842 11/25/20 2257 11/26/20 0000  BP: (!) 136/105 134/85 (!) 148/73 (!) 145/88  Pulse: 84 74 69 60  Resp: 19 18 17 18   Temp: 97.8 F (36.6 C)     SpO2: 100% 100% 100% 100%   General: WDWN, Alert and oriented x3.  Eyes: EOMI, PERRL, lids conjunctivae normal.  Sclera nonicteric HENT:  [place]/AT, external ears normal.  Nares patent without epistasis.  Mucous membranes are moist.  Neck: Soft, normal range of motion, supple, no masses, no thyromegaly.  Trachea midline Respiratory: clear to auscultation bilaterally, no wheezing, no crackles. Normal respiratory effort. No accessory muscle use.  Cardiovascular: Regular rate and rhythm, no murmurs / rubs / gallops. No extremity edema. 2+ pedal pulses.   Abdomen: Soft, mild lower left abdominal tenderness, nondistended, no rebound or guarding.  No masses palpated. No hepatosplenomegaly. Bowel sounds normoactive Musculoskeletal: FROM. no cyanosis. No joint deformity  upper and lower extremities. Normal muscle tone.  Skin: Warm, dry, intact no rashes, lesions, ulcers. No induration Neurologic: CN 2-12 grossly intact.  Normal speech.  Sensation intact, patella DTR +1 bilaterally. Strength 5/5 in all extremities.   Psychiatric: Normal judgment and insight.  Normal mood.    Labs on Admission: I have personally reviewed following labs and imaging studies  CBC: Recent Labs  Lab 11/25/20 1812  WBC 10.8*  HGB 15.9  HCT 45.9  MCV 93.1  PLT 176    Basic Metabolic Panel: Recent Labs  Lab 11/25/20 1812  NA 140  K 3.4*  CL 107  CO2 25  GLUCOSE 68*  BUN 16  CREATININE 1.35*  CALCIUM 9.4    GFR: CrCl cannot be calculated (Unknown ideal weight.).  Liver Function Tests: Recent Labs  Lab 11/25/20 1812  AST 29  ALT 25  ALKPHOS 76  BILITOT 0.7  PROT 7.8  ALBUMIN 4.7    Urine analysis: No results found for: COLORURINE, APPEARANCEUR, LABSPEC, PHURINE, GLUCOSEU, HGBUR, BILIRUBINUR, KETONESUR, PROTEINUR, UROBILINOGEN, NITRITE, LEUKOCYTESUR  Radiological Exams on Admission: CT ABDOMEN PELVIS W  CONTRAST  Result Date: 11/25/2020 CLINICAL DATA:  Bright red blood per rectum, lower abdominal pain, multiple bowel movements EXAM: CT ABDOMEN AND PELVIS WITH CONTRAST TECHNIQUE: Multidetector CT imaging of the abdomen and pelvis was performed using the standard protocol following bolus administration of intravenous contrast. CONTRAST:  100mL OMNIPAQUE IOHEXOL 300 MG/ML  SOLN COMPARISON:  None. FINDINGS: Lower chest: Lung bases are clear. Normal heart size. No pericardial effusion. Hepatobiliary: Diffuse hepatic hypoattenuation compatible with hepatic steatosis. Smooth liver surface contour. No concerning focal liver lesion. Gallbladder partly decompressed. No visible calcified gallstone or biliary ductal dilatation. No pericholecystic inflammation. Pancreas: No pancreatic ductal dilatation or surrounding inflammatory changes. Spleen: Normal in size. No  concerning splenic lesions. Small accessory splenule towards the hilum. Adrenals/Urinary Tract: Kidneys are normally located with symmetric enhancement and excretion. No suspicious renal lesion, urolithiasis or hydronephrosis. Urinary bladder is largely decompressed at the time of exam and therefore poorly evaluated by CT imaging. Mild bladder wall thickening may be related to underdistention without other gross bladder abnormality. Stomach/Bowel: Distal esophagus, stomach and duodenum are unremarkable. No small bowel thickening or dilatation. Normal appendix without inflammation in the right lower quadrant. Proximal colon is unremarkable. There is a focally thickened segment of colon at the splenic flexure with very faint surrounding hazy mesenteric change and increased vascularity. A more normal appearance of the distal colonic segments. No evidence of obstruction. Vascular/Lymphatic: No significant vascular findings are present. No enlarged abdominal or pelvic lymph nodes. Reproductive: The prostate and seminal vesicles are unremarkable. Other: Small bilateral fat containing inguinal hernias. No bowel containing hernia. No abdominopelvic free air or fluid. Musculoskeletal: No acute osseous abnormality or suspicious osseous lesion. Prior posterior decompressive changes in interbody fusion L3-S1. IMPRESSION: 1. Focal segment of edematous colonic thickening noted at the splenic flexure with some hazy surrounding mesenteric change. No resulting obstruction. While this could reflect a focal colitis, an underlying lesion is not fully excluded and finding should prompt further evaluation with direct visualization. 2. No significant diverticular disease. 3. Mild bladder wall thickening, likely related to underdistention. Electronically Signed   By: Kreg ShropshirePrice  DeHay M.D.   On: 11/25/2020 22:55     Assessment/Plan Principal Problem:   Colitis Mr. Wallace CullensGray is admitted to Med/Surg floor.  GI consulted by ER provider to  evaluate pt in am. Pt had normal colonoscopy a year ago he states.  Started on Cipro and Flagyl for antibiotic coverage.  IVF hydration with LR at 125 ml/hr  Active Problems:   Bright red blood per rectum GI consulted and will evaluate in am.  Check serial Hgb/Hct levels every 6 hours Type and screen done. Will transfuse if Hgb precipitously drops and/or pt symptomatic.    DVT prophylaxis: Early ambulation and TED hose for DVT prophylaxis. Padua score low  Code Status:   Full Code  Family Communication:  Diagnosis and plan discussed with patient.  Patient verbalized understanding agrees with plan.  Further recommendations to follow as clinically indicated Disposition Plan:   Patient is from:  Home  Anticipated DC to:  Home  Anticipated DC date:  Anticipate discharge in 2 midnights  Anticipated DC barriers: No barriers to discharge identified at this time  Consults called:  Gastroenterology, consulted by ER provider  Admission status:  Inpatient   Claudean SeveranceBradley S Dam Ashraf MD [facility]  How to contact the [facility] Attending or Consulting provider 7A - 7P or covering provider during after hours 7P -7A, for this patient?   Check the care team in [facility] and  look for a) attending/consulting [facility] provider listed and b) the [facility] team listed Log into [email or web address] and use [facility]'s universal password to access. If you do not have the password, please contact the hospital operator. Locate the [facility] provider you are looking for under [facility] and page to a number that you can be directly reached. If you still have difficulty reaching the provider, please page the [facility] (Director on Call) for the Hospitalists listed on amion for assistance.  11/26/2020, 1:03 AM

## 2020-11-26 NOTE — Progress Notes (Signed)
SBAR reviewed, marked as accepted and ready for patient, message sent through secure chat to also notify RN Tabitha that patient can come up at any time now, will await patient.

## 2020-11-27 LAB — CBC WITH DIFFERENTIAL/PLATELET
Abs Immature Granulocytes: 0.01 10*3/uL (ref 0.00–0.07)
Basophils Absolute: 0.1 10*3/uL (ref 0.0–0.1)
Basophils Relative: 1 %
Eosinophils Absolute: 0.1 10*3/uL (ref 0.0–0.5)
Eosinophils Relative: 1 %
HCT: 40.7 % (ref 39.0–52.0)
Hemoglobin: 14.4 g/dL (ref 13.0–17.0)
Immature Granulocytes: 0 %
Lymphocytes Relative: 37 %
Lymphs Abs: 2.9 10*3/uL (ref 0.7–4.0)
MCH: 32.9 pg (ref 26.0–34.0)
MCHC: 35.4 g/dL (ref 30.0–36.0)
MCV: 92.9 fL (ref 80.0–100.0)
Monocytes Absolute: 0.7 10*3/uL (ref 0.1–1.0)
Monocytes Relative: 9 %
Neutro Abs: 4 10*3/uL (ref 1.7–7.7)
Neutrophils Relative %: 52 %
Platelets: 152 10*3/uL (ref 150–400)
RBC: 4.38 MIL/uL (ref 4.22–5.81)
RDW: 12.4 % (ref 11.5–15.5)
WBC: 7.8 10*3/uL (ref 4.0–10.5)
nRBC: 0 % (ref 0.0–0.2)

## 2020-11-27 LAB — BASIC METABOLIC PANEL
Anion gap: 7 (ref 5–15)
BUN: 14 mg/dL (ref 6–20)
CO2: 28 mmol/L (ref 22–32)
Calcium: 9.1 mg/dL (ref 8.9–10.3)
Chloride: 104 mmol/L (ref 98–111)
Creatinine, Ser: 1.24 mg/dL (ref 0.61–1.24)
GFR, Estimated: >60 mL/min (ref >60)
Glucose, Bld: 102 mg/dL — ABNORMAL HIGH (ref 70–99)
Potassium: 3.7 mmol/L (ref 3.5–5.1)
Sodium: 139 mmol/L (ref 135–145)

## 2020-11-27 LAB — PROTIME-INR
INR: 0.9 (ref 0.8–1.2)
Prothrombin Time: 12.6 seconds (ref 11.4–15.2)

## 2020-11-27 MED ORDER — METRONIDAZOLE 500 MG PO TABS: 500.0000 mg | ORAL_TABLET | Freq: Three times a day (TID) | ORAL | 0 refills | Status: AC

## 2020-11-27 MED ORDER — CIPROFLOXACIN HCL 500 MG PO TABS: 500.0000 mg | ORAL_TABLET | Freq: Two times a day (BID) | ORAL | 0 refills | Status: AC

## 2020-11-27 MED ORDER — POLYETHYLENE GLYCOL 3350 17 G PO PACK: 17.0000 g | PACK | Freq: Every day | ORAL | 0 refills | Status: AC | PRN

## 2020-11-27 MED ORDER — POLYETHYLENE GLYCOL 3350 17 G PO PACK
17.0000 g | PACK | Freq: Every day | ORAL | Status: DC
  Administered 2020-11-27: 17 g via ORAL
  Filled 2020-11-27: qty 1

## 2020-11-27 NOTE — Plan of Care (Signed)

## 2020-11-27 NOTE — Progress Notes (Signed)
Pt was discharged home today. Instructions were reviewed with patient, and questions were answered. Pt was taken to main entrance via wheelchair by NT.  

## 2020-11-27 NOTE — Discharge Summary (Signed)
Physician Discharge Summary  Dustin Pearson:[id number] DOB: [birth year] DOA: 11/25/2020  PCP: Verl Bangs, MD  Admit date: 11/25/2020 Discharge date: 11/27/2020 30 Day Unplanned Readmission Risk Score    Flowsheet Row ED to Hosp-Admission (Current) from 11/25/2020 in [facility]  30 Day Unplanned Readmission Risk Score (%) 6.9 Filed at 11/27/2020 0801       This score is the patient's risk of an unplanned readmission within 30 days of being discharged (0 -100%). The score is based on dignosis, age, lab data, medications, orders, and past utilization.   Low:  0-14.9   Medium: 15-21.9   High: 22-29.9   Extreme: 30 and above           Admitted From: Home Disposition: Home  Recommendations for Outpatient Follow-up:  Follow up with PCP in 1-2 weeks Follow-up with your primary GI in 1 to 2 weeks about further discussion of need of colonoscopy Please obtain BMP/CBC in one week Please follow up with your PCP on the following pending results: Unresulted Labs (From admission, onward)     Start     Ordered   11/26/20 1134  CBC with Differential/Platelet  5A & 5P,   R (with TIMED occurrences)      11/26/20 1133              Home Health: None Equipment/Devices: None  Discharge Condition: Stable CODE STATUS: Full code Diet recommendation: Cardiac  Subjective: Seen and examined.  Feels much better.  No abdominal pain or nausea.  Tolerated regular diet.  Seen by GI and cleared for discharge.  Patient agreeable with the discharge plan as well.  Brief/Interim Summary:  TYLEK BONEY is a 60 y.o. male with medical history significant for DDD and chronic back pain who presented for evaluation left lower quadrant abdominal pain.  Abdominal pain was followed by some diarrhea and around 11 AM he had bright red blood for bowel movements.  He continued to have episodes of bright red blood per rectum every few hours so he came to the emergency department.  had a normal  colonoscopy a year ago at [facility].   He denies any change in his diet or eating undercooked or raw meats.  He does take NSAIDs occasionally for his chronic back pain but he has not had any in the last 2 weeks.  Eventually he was diagnosed with acute colitis at the splenic flexure.  He was admitted to hospitalist service.  Started on antibiotics.  GI consulted.  His symptoms were typical of ischemic colitis.  Patient's last rectal bleeding was at 2 AM yesterday morning.  He has not had any further bowel movement.  He has been tolerating regular diet.  Hemoglobin has remained stable.  Since he is a stable with stable hemoglobin and no further abdominal pain or rectal bleeding, GI recommended possible outpatient colonoscopy but defer to patient's primary gastroenterologist.  This was discussed with the patient and he is in agreement as well.  Per the recommendation, he is being discharged on 7 days of ciprofloxacin and Flagyl as well as as needed MiraLAX to avoid constipation.  Discharge Diagnoses:  Principal Problem:   Colitis Active Problems:   Bright red blood per rectum    Discharge Instructions   Allergies as of 11/27/2020       Reactions   Gabapentin    Other reaction(s): Other (See Comments) Dry mouth        Medication List     STOP  taking these medications    amoxicillin-clavulanate 875-125 MG tablet Commonly known as: Augmentin       TAKE these medications    acyclovir 400 MG tablet Commonly known as: ZOVIRAX Take 1 tablet by mouth 2 (two) times daily.   ciprofloxacin 500 MG tablet Commonly known as: Cipro Take 1 tablet (500 mg total) by mouth 2 (two) times daily for 7 days.   cyclobenzaprine 10 MG tablet Commonly known as: FLEXERIL Take 10 mg by mouth 2 (two) times daily.   DULoxetine 20 MG capsule Commonly known as: CYMBALTA Take 20 mg by mouth 2 (two) times daily.   metroNIDAZOLE 500 MG tablet Commonly known as: Flagyl Take 1 tablet (500 mg total) by mouth  3 (three) times daily for 7 days.   multivitamin with minerals Tabs tablet Take 1 tablet by mouth daily.   omeprazole 20 MG capsule Commonly known as: PRILOSEC Take 20 mg by mouth daily.   polyethylene glycol 17 g packet Commonly known as: MIRALAX / GLYCOLAX Take 17 g by mouth daily as needed for up to 7 days.   rosuvastatin 10 MG tablet Commonly known as: CRESTOR Take 10 mg by mouth at bedtime.   tiZANidine 4 MG tablet Commonly known as: ZANAFLEX Take 4 mg by mouth at bedtime.        Follow-up Information     Radiontchenko, Alexei, MD Follow up in 1 week(s).   Specialty: Family Medicine Contact information: [address] [place] [address] [phone]         Zachery Dakins, MD Follow up in 1 week(s).   Specialty: Internal Medicine Contact information: [address] [place] [address] [place] [address] [phone]                Allergies  Allergen Reactions   Gabapentin     Other reaction(s): Other (See Comments) Dry mouth    Consultations: GI   Procedures/Studies: CT ABDOMEN PELVIS W CONTRAST  Result Date: 11/25/2020 CLINICAL DATA:  Bright red blood per rectum, lower abdominal pain, multiple bowel movements EXAM: CT ABDOMEN AND PELVIS WITH CONTRAST TECHNIQUE: Multidetector CT imaging of the abdomen and pelvis was performed using the standard protocol following bolus administration of intravenous contrast. CONTRAST:  OMNIPAQUE IOHEXOL 300 MG/ML  SOLN COMPARISON:  None. FINDINGS: Lower chest: Lung bases are clear. Normal heart size. No pericardial effusion. Hepatobiliary: Diffuse hepatic hypoattenuation compatible with hepatic steatosis. Smooth liver surface contour. No concerning focal liver lesion. Gallbladder partly decompressed. No visible calcified gallstone or biliary ductal dilatation. No pericholecystic inflammation. Pancreas: No pancreatic ductal dilatation or surrounding inflammatory changes. Spleen: Normal in size.  No concerning splenic lesions. Small accessory splenule towards the hilum. Adrenals/Urinary Tract: Kidneys are normally located with symmetric enhancement and excretion. No suspicious renal lesion, urolithiasis or hydronephrosis. Urinary bladder is largely decompressed at the time of exam and therefore poorly evaluated by CT imaging. Mild bladder wall thickening may be related to underdistention without other gross bladder abnormality. Stomach/Bowel: Distal esophagus, stomach and duodenum are unremarkable. No small bowel thickening or dilatation. Normal appendix without inflammation in the right lower quadrant. Proximal colon is unremarkable. There is a focally thickened segment of colon at the splenic flexure with very faint surrounding hazy mesenteric change and increased vascularity. A more normal appearance of the distal colonic segments. No evidence of obstruction. Vascular/Lymphatic: No significant vascular findings are present. No enlarged abdominal or pelvic lymph nodes. Reproductive: The prostate and seminal vesicles are unremarkable. Other: Small bilateral fat containing inguinal  hernias. No bowel containing hernia. No abdominopelvic free air or fluid. Musculoskeletal: No acute osseous abnormality or suspicious osseous lesion. Prior posterior decompressive changes in interbody fusion L3-S1. IMPRESSION: 1. Focal segment of edematous colonic thickening noted at the splenic flexure with some hazy surrounding mesenteric change. No resulting obstruction. While this could reflect a focal colitis, an underlying lesion is not fully excluded and finding should prompt further evaluation with direct visualization. 2. No significant diverticular disease. 3. Mild bladder wall thickening, likely related to underdistention. Electronically Signed   By: Kreg Shropshire M.D.   On: 11/25/2020 22:55     Discharge Exam: Vitals:   11/27/20 0141 11/27/20 0559  BP: 133/76 (!) 137/94  Pulse: (!) 59 (!) 52  Resp: 16 18  Temp:  (!) 97.5 F (36.4 C) (!) 97.5 F (36.4 C)  SpO2: 99% 100%   Vitals:   11/26/20 1719 11/26/20 2119 11/27/20 0141 11/27/20 0559  BP: (!) 144/87 (!) 142/90 133/76 (!) 137/94  Pulse: 67 60 (!) 59 (!) 52  Resp: 18 17 16 18   Temp: (!) 96 F (35.6 C) 97.8 F (36.6 C) (!) 97.5 F (36.4 C) (!) 97.5 F (36.4 C)  TempSrc:  Oral    SpO2: 100% 100% 99% 100%  Weight:      Height:        General: Pt is alert, awake, not in acute distress Cardiovascular: RRR, S1/S2 +, no rubs, no gallops Respiratory: CTA bilaterally, no wheezing, no rhonchi Abdominal: Soft, NT, ND, bowel sounds + Extremities: no edema, no cyanosis    The results of significant diagnostics from this hospitalization (including imaging, microbiology, ancillary and laboratory) are listed below for reference.     Microbiology: Recent Results (from the past 240 hour(s))  SARS CORONAVIRUS 2 (TAT 6-24 HRS) Nasopharyngeal Nasopharyngeal Swab     Status: None   Collection Time: 11/25/20 11:48 PM   Specimen: Nasopharyngeal Swab  Result Value Ref Range Status   SARS Coronavirus 2 NEGATIVE NEGATIVE Final    Comment: (NOTE) SARS-CoV-2 target nucleic acids are NOT DETECTED.  The SARS-CoV-2 RNA is generally detectable in upper and lower respiratory specimens during the acute phase of infection. Negative results do not preclude SARS-CoV-2 infection, do not rule out co-infections with other pathogens, and should not be used as the sole basis for treatment or other patient management decisions. Negative results must be combined with clinical observations, patient history, and epidemiological information. The expected result is Negative.  Fact Sheet for Patients: 11/27/20  Fact Sheet for Healthcare Providers: HairSlick.no  This test is not yet approved or cleared by the [place] FDA and  has been authorized for detection and/or diagnosis of SARS-CoV-2 by FDA  under an Emergency Use Authorization (EUA). This EUA will remain  in effect (meaning this test can be used) for the duration of the COVID-19 declaration under Se ction 564(b)(1) of the Act, 21 U.S.C. section 360bbb-3(b)(1), unless the authorization is terminated or revoked sooner.  Performed at [facility], [address], [place], [place]      Labs: BNP (last 3 results) No results for input(s): BNP in the last 8760 hours. Basic Metabolic Panel: Recent Labs  Lab 11/25/20 1812 11/26/20 0756 11/27/20 0350  NA 140 140 139  K 3.4* 4.0 3.7  CL 107 104 104  CO2 25 26 28   GLUCOSE 68* 99 102*  BUN 16 15 14   CREATININE 1.35* 1.27* 1.24  CALCIUM 9.4 9.1 9.1   Liver Function Tests:  Recent Labs  Lab 11/25/20 1812  AST 29  ALT 25  ALKPHOS 76  BILITOT 0.7  PROT 7.8  ALBUMIN 4.7   No results for input(s): LIPASE, AMYLASE in the last 168 hours. No results for input(s): AMMONIA in the last 168 hours. CBC: Recent Labs  Lab 11/25/20 1812 11/26/20 0625 11/26/20 0756 11/26/20 1209 11/26/20 1711 11/27/20 0350  WBC 10.8*  --  9.2 8.8 7.9 7.8  NEUTROABS  --   --   --  5.6 4.3 4.0  HGB 15.9 14.7 15.1 15.8 17.5* 14.4  HCT 45.9 41.8 43.0 44.1 49.0 40.7  MCV 93.1  --  92.3 91.3 90.9 92.9  PLT 176  --  143* 134* 128* 152   Cardiac Enzymes: No results for input(s): CKTOTAL, CKMB, CKMBINDEX, TROPONINI in the last 168 hours. BNP: Invalid input(s): POCBNP CBG: Recent Labs  Lab 11/25/20 2258 11/25/20 2330 11/25/20 2356 11/26/20 0259  GLUCAP 52* 39* 59* 106*   D-Dimer No results for input(s): DDIMER in the last 72 hours. Hgb A1c No results for input(s): HGBA1C in the last 72 hours. Lipid Profile No results for input(s): CHOL, HDL, LDLCALC, TRIG, CHOLHDL, LDLDIRECT in the last 72 hours. Thyroid function studies No results for input(s): TSH, T4TOTAL, T3FREE, THYROIDAB in the last 72 hours.  Invalid input(s): FREET3 Anemia work up No results for input(s):  VITAMINB12, FOLATE, FERRITIN, TIBC, IRON, RETICCTPCT in the last 72 hours. Urinalysis No results found for: COLORURINE, APPEARANCEUR, LABSPEC, PHURINE, GLUCOSEU, HGBUR, BILIRUBINUR, KETONESUR, PROTEINUR, UROBILINOGEN, NITRITE, LEUKOCYTESUR Sepsis Labs Invalid input(s): PROCALCITONIN,  WBC,  LACTICIDVEN Microbiology Recent Results (from the past 240 hour(s))  SARS CORONAVIRUS 2 (TAT 6-24 HRS) Nasopharyngeal Nasopharyngeal Swab     Status: None   Collection Time: 11/25/20 11:48 PM   Specimen: Nasopharyngeal Swab  Result Value Ref Range Status   SARS Coronavirus 2 NEGATIVE NEGATIVE Final    Comment: (NOTE) SARS-CoV-2 target nucleic acids are NOT DETECTED.  The SARS-CoV-2 RNA is generally detectable in upper and lower respiratory specimens during the acute phase of infection. Negative results do not preclude SARS-CoV-2 infection, do not rule out co-infections with other pathogens, and should not be used as the sole basis for treatment or other patient management decisions. Negative results must be combined with clinical observations, patient history, and epidemiological information. The expected result is Negative.  Fact Sheet for Patients: HairSlick.nohttps://www.fda.gov/media/138098/download  Fact Sheet for Healthcare Providers: quierodirigir.comhttps://www.fda.gov/media/138095/download  This test is not yet approved or cleared by the [place] FDA and  has been authorized for detection and/or diagnosis of SARS-CoV-2 by FDA under an Emergency Use Authorization (EUA). This EUA will remain  in effect (meaning this test can be used) for the duration of the COVID-19 declaration under Se ction 564(b)(1) of the Act, 21 U.S.C. section 360bbb-3(b)(1), unless the authorization is terminated or revoked sooner.  Performed at [facility], [address] [place], [place] [id number]      Time coordinating discharge: Over 30 minutes  SIGNED:   Hughie Clossavi Kristene Liberati, MD  [facility] 11/27/2020, 10:26  AM  If 7PM-7AM, please contact night-coverage [email or web address]

## 2020-11-27 NOTE — Progress Notes (Signed)
[facility] Progress Note  TYEE VANDEVOORDE 60 y.o. [birth year]  CC:  hematochezia, and AB pain.   Subjective: Patient lying comfortably in bed.  Current only abdominal pain states is more epigastric and upper abdominal pain 2 out of 10 very mild.  Patient's been on full diet since yesterday evening and has been eating well.  No nausea or vomiting. Last bowel movement was yesterday at 4 AM still had hematochezia at that time.  Patient has been having some abdominal cramping mild feeling the need to have a bowel movement but unable to go.  Denies fever or chills Stable H/H 15.1 to 14.4  ROS : Review of Systems  Constitutional:  Negative for chills and fever.  Respiratory:  Negative for shortness of breath.   Cardiovascular:  Negative for chest pain and leg swelling.  Gastrointestinal:  Positive for abdominal pain and constipation. Negative for blood in stool, diarrhea, heartburn, melena, nausea and vomiting.  Musculoskeletal:  Negative for falls.  Neurological:  Negative for dizziness and loss of consciousness.  Psychiatric/Behavioral:  Negative for memory loss.      Objective: Vital signs in last 24 hours: Vitals:   11/27/20 0141 11/27/20 0559  BP: 133/76 (!) 137/94  Pulse: (!) 59 (!) 52  Resp: 16 18  Temp: (!) 97.5 F (36.4 C) (!) 97.5 F (36.4 C)  SpO2: 99% 100%    Physical Exam: Physical Exam   Lab Results: Recent Labs    11/26/20 0756 11/27/20 0350  NA 140 139  K 4.0 3.7  CL 104 104  CO2 26 28  GLUCOSE 99 102*  BUN 15 14  CREATININE 1.27* 1.24  CALCIUM 9.1 9.1   Recent Labs    11/25/20 1812  AST 29  ALT 25  ALKPHOS 76  BILITOT 0.7  PROT 7.8  ALBUMIN 4.7   Recent Labs    11/26/20 1711 11/27/20 0350  WBC 7.9 7.8  NEUTROABS 4.3 4.0  HGB 17.5* 14.4  HCT 49.0 40.7  MCV 90.9 92.9  PLT 128* 152   Recent Labs    11/26/20 0030 11/27/20 0350  LABPROT 30.0* 12.6  INR 2.9* 0.9   Studies/Results: CT ABDOMEN PELVIS W CONTRAST  Result  Date: 11/25/2020 CLINICAL DATA:  Bright red blood per rectum, lower abdominal pain, multiple bowel movements EXAM: CT ABDOMEN AND PELVIS WITH CONTRAST TECHNIQUE: Multidetector CT imaging of the abdomen and pelvis was performed using the standard protocol following bolus administration of intravenous contrast. CONTRAST:  OMNIPAQUE IOHEXOL 300 MG/ML  SOLN COMPARISON:  None. FINDINGS: Lower chest: Lung bases are clear. Normal heart size. No pericardial effusion. Hepatobiliary: Diffuse hepatic hypoattenuation compatible with hepatic steatosis. Smooth liver surface contour. No concerning focal liver lesion. Gallbladder partly decompressed. No visible calcified gallstone or biliary ductal dilatation. No pericholecystic inflammation. Pancreas: No pancreatic ductal dilatation or surrounding inflammatory changes. Spleen: Normal in size. No concerning splenic lesions. Small accessory splenule towards the hilum. Adrenals/Urinary Tract: Kidneys are normally located with symmetric enhancement and excretion. No suspicious renal lesion, urolithiasis or hydronephrosis. Urinary bladder is largely decompressed at the time of exam and therefore poorly evaluated by CT imaging. Mild bladder wall thickening may be related to underdistention without other gross bladder abnormality. Stomach/Bowel: Distal esophagus, stomach and duodenum are unremarkable. No small bowel thickening or dilatation. Normal appendix without inflammation in the right lower quadrant. Proximal colon is unremarkable. There is a focally thickened segment of colon at the splenic flexure with very faint surrounding hazy mesenteric change and increased vascularity.  A more normal appearance of the distal colonic segments. No evidence of obstruction. Vascular/Lymphatic: No significant vascular findings are present. No enlarged abdominal or pelvic lymph nodes. Reproductive: The prostate and seminal vesicles are unremarkable. Other: Small bilateral fat containing  inguinal hernias. No bowel containing hernia. No abdominopelvic free air or fluid. Musculoskeletal: No acute osseous abnormality or suspicious osseous lesion. Prior posterior decompressive changes in interbody fusion L3-S1. IMPRESSION: 1. Focal segment of edematous colonic thickening noted at the splenic flexure with some hazy surrounding mesenteric change. No resulting obstruction. While this could reflect a focal colitis, an underlying lesion is not fully excluded and finding should prompt further evaluation with direct visualization. 2. No significant diverticular disease. 3. Mild bladder wall thickening, likely related to underdistention. Electronically Signed   By: Kreg Shropshire M.D.   On: 11/25/2020 22:55    Assessment/Plan: Splenic flexure colitis, hematochezia, and AB pain.  Receiving antibiotics, will continue ABX out patient for a 3-4 days, total of 7 INR back to normal, likely lab error.  Last episode of hematochezia yesterday at 4 AM, patient since that time has not had a bowel movement.  Last morphine was given yesterday at 1400. Will start on MiraLAX 2 doses today to avoid constipation, can continue outpatient if needed, encourage hydration out patient. Discussed potentially doing inpatient versus outpatient colonoscopy, at this time patient's abdominal pain has improved, on full diet,  and he would prefer to do an outpatient colonoscopy with his physician in [place].Patient agrees with this plan.   Doree Albee PA-C 11/27/2020, 8:50 AM  Contact #  [phone]

## 2023-11-24 ENCOUNTER — Other Ambulatory Visit: Payer: Self-pay

## 2023-11-24 ENCOUNTER — Emergency Department (HOSPITAL_BASED_OUTPATIENT_CLINIC_OR_DEPARTMENT_OTHER)
Admission: EM | Admit: 2023-11-24 | Discharge: 2023-11-24 | Disposition: A | Discharge status: Home / Self Care | Attending: Emergency Medicine | Admitting: Emergency Medicine

## 2023-11-24 ENCOUNTER — Emergency Department (HOSPITAL_BASED_OUTPATIENT_CLINIC_OR_DEPARTMENT_OTHER): Admitting: Radiology

## 2023-11-24 ENCOUNTER — Encounter (HOSPITAL_BASED_OUTPATIENT_CLINIC_OR_DEPARTMENT_OTHER): Payer: Self-pay

## 2023-11-24 DIAGNOSIS — J069 Acute upper respiratory infection, unspecified: Secondary | ICD-10-CM | POA: Diagnosis not present

## 2023-11-24 DIAGNOSIS — R059 Cough, unspecified: Secondary | ICD-10-CM | POA: Diagnosis present

## 2023-11-24 LAB — RESP PANEL BY RT-PCR (RSV, FLU A&B, COVID)  RVPGX2
Influenza A by PCR: NEGATIVE
Influenza B by PCR: NEGATIVE
Resp Syncytial Virus by PCR: NEGATIVE
SARS Coronavirus 2 by RT PCR: NEGATIVE

## 2023-11-24 MED ORDER — DEXAMETHASONE 4 MG PO TABS
4.0000 mg | ORAL_TABLET | Freq: Once | ORAL | Status: AC
  Administered 2023-11-24: 4 mg via ORAL
  Filled 2023-11-24: qty 1

## 2023-11-24 MED ORDER — BENZONATATE 100 MG PO CAPS: 100.0000 mg | ORAL_CAPSULE | Freq: Three times a day (TID) | ORAL | 0 refills | Status: AC

## 2023-11-24 NOTE — ED Provider Notes (Signed)
 [place] EMERGENCY DEPARTMENT AT [facility] Provider Note   CSN: [id number] Arrival date & time: 11/24/23  1229     Patient presents with: Generalized Body Aches and Cough   NEERAJ HOUSAND is a 63 y.o. male.   Patient is a 63 year old male with no significant past medical history presenting to the emergency department with cough and congestion.  Patient states that he has been sick for about the last 5 days.  He states that he had a fever the first day but that has since resolved.  States that he has a cough productive of mucus.  Has associated congestion and sore throat and bodyaches.  He denies any nausea, vomiting or diarrhea.  States that he has had some headaches.  States that he has had COVID and pneumonia in the past and this does feel somewhat similar.  He states he has been trying over-the-counter medications without significant relief.  The history is provided by the patient.  Cough      Prior to Admission medications   Medication Sig Start Date End Date Taking? Authorizing Provider  benzonatate (TESSALON) 100 MG capsule Take 1 capsule (100 mg total) by mouth every 8 (eight) hours. 11/24/23  Yes Kingsley, Aleece Loyd K, DO  acyclovir  (ZOVIRAX ) 400 MG tablet Take 1 tablet by mouth 2 (two) times daily. 08/14/15   [provider]  cyclobenzaprine  (FLEXERIL ) 10 MG tablet Take 10 mg by mouth 2 (two) times daily.    [provider]  DULoxetine  (CYMBALTA ) 20 MG capsule Take 20 mg by mouth 2 (two) times daily. 04/27/20   [provider]  Multiple Vitamin (MULTIVITAMIN WITH MINERALS) TABS tablet Take 1 tablet by mouth daily.    [provider]  omeprazole (PRILOSEC) 20 MG capsule Take 20 mg by mouth daily. 09/28/20   [provider]  rosuvastatin  (CRESTOR ) 10 MG tablet Take 10 mg by mouth at bedtime. 09/28/20   [provider]  tiZANidine  (ZANAFLEX ) 4 MG tablet Take 4 mg by mouth at bedtime. 10/21/20   [provider]     Allergies: Gabapentin    Review of Systems  Respiratory:  Positive for cough.     Updated Vital Signs BP (!) 145/107 (BP Location: Right Arm)   Pulse 95   Temp 98.7 F (37.1 C)   Resp 20   Ht 6' 3 (1.905 m)   Wt 108.9 kg   SpO2 96%   BMI 30.00 kg/m   Physical Exam Vitals and nursing note reviewed.  Constitutional:      General: He is not in acute distress.    Appearance: Normal appearance.  HENT:     Head: Normocephalic and atraumatic.     Nose: Congestion present.     Mouth/Throat:     Mouth: Mucous membranes are moist.     Pharynx: Posterior oropharyngeal erythema present. No oropharyngeal exudate.     Comments: No tonsillar swelling, uvula midline, normal phonation, tolerating secretions, no trismus  Eyes:     Extraocular Movements: Extraocular movements intact.     Conjunctiva/sclera: Conjunctivae normal.    Cardiovascular:     Rate and Rhythm: Normal rate and regular rhythm.     Heart sounds: Normal heart sounds.  Pulmonary:     Effort: Pulmonary effort is normal.     Breath sounds: Normal breath sounds.  Abdominal:     General: Abdomen is flat.   Musculoskeletal:        General: Normal range of motion.  Cervical back: Normal range of motion.   Skin:    General: Skin is warm and dry.   Neurological:     General: No focal deficit present.     Mental Status: He is alert and oriented to person, place, and time.   Psychiatric:        Mood and Affect: Mood normal.        Behavior: Behavior normal.     (all labs ordered are listed, but only abnormal results are displayed) Labs Reviewed  RESP PANEL BY RT-PCR (RSV, FLU A&B, COVID)  [id number]    EKG: None  Radiology: DG Chest 2 View Result Date: 11/24/2023 CLINICAL DATA:  Shortness of breath. EXAM: CHEST - 2 VIEW COMPARISON:  None available currently. FINDINGS: The heart size and mediastinal contours are within normal limits. Both lungs are clear. The visualized skeletal structures are  unremarkable. IMPRESSION: No active cardiopulmonary disease. Electronically Signed   By: Lynwood Landy Raddle M.D.   On: 11/24/2023 14:13     Procedures   Medications Ordered in the ED  dexamethasone (DECADRON) tablet 4 mg (4 mg Oral Given 11/24/23 1403)    Clinical Course as of 11/24/23 1454  Fri Nov 24, 2023  1422 No acute disease on CXR. Suspect viral syndrome. Patient is stable for discharge home. Will recommend continued symptomatic treatment and primary care follow up. [VK]    Clinical Course User Index [VK] Kingsley, Marc Leichter K, DO                                 Medical Decision Making This patient presents to the ED with chief complaint(s) of cough, congestion with no pertinent past medical history of  which further complicates the presenting complaint. The complaint involves an extensive differential diagnosis and also carries with it a high risk of complications and morbidity.    The differential diagnosis includes viral syndrome, patient has no focal lung sounds and is satting well on room air making pneumonia unlikely, patient's uvula is midline is no trismus is tolerating secretions and has normal phonation making RPA, PTA or Ludwig's unlikely, patient has no signs of severe dehydration on exam   Additional history obtained: Additional history obtained from n/a Records reviewed Care Everywhere/External Records  ED Course and Reassessment: Patient had viral swab performed in triage which was negative. With his age and associated SOB will have CXR performed to evaluate for possible pneumonia. Will be given decadron for symptomatic management of sore throat.  They recommended Tylenol  and Motrin further fevers and bodyaches, declined any at this time  Independent labs interpretation:  The following labs were independently interpreted: viral swab negative  Independent visualization of imaging: - I independently visualized the following imaging with scope of interpretation  limited to determining acute life threatening conditions related to emergency care: CXR, which revealed no acute disease  Consultation: - Consulted or discussed management/test interpretation w/ external professional: N/A  Consideration for admission or further workup: Patient has no emergent conditions requiring admission or further work-up at this time and is stable for discharge home with primary care follow-up  Social Determinants of health: N/A    Amount and/or Complexity of Data Reviewed Radiology: ordered.  Risk Prescription drug management.       Final diagnoses:  Viral URI with cough    ED Discharge Orders          Ordered    benzonatate (TESSALON) 100 MG  capsule  Every 8 hours        11/24/23 1453               Kingsley, Tayia Stonesifer K, DO 11/24/23 1454

## 2023-11-24 NOTE — Discharge Instructions (Signed)
 You were seen in the emergency department for your cough and congestion.  You tested negative for COVID, flu and RSV and your chest XR was normal.  You did not require treatment with any antiviral medications can continue symptomatic treatment with Tylenol  and Motrin as needed for fevers and bodyaches and both can be taken up to every 6 hours.  I have given you a prescription of a cough medication that you can take as needed.  You can also try Mucinex or raw honey as needed for your cough and you can use a humidifier or hot steam from the shower to help with your congestion as well as over-the-counter and nasal decongestant sprays.  You can follow-up with your primary doctor in the next few days to have your symptoms rechecked.  You should return to the emergency department for significantly worsening shortness of breath, severe chest pain, repetitive vomiting or if you have any other new or concerning symptoms.

## 2023-11-24 NOTE — ED Triage Notes (Addendum)
 In for eval of sinus pain, sore throat, body aches, slight productive cough with yellow sputum, and headache onset 5-6 days ago. Fever initially but resolved in the last several days. Denies N/V/D.

## 2024-02-28 ENCOUNTER — Emergency Department (HOSPITAL_BASED_OUTPATIENT_CLINIC_OR_DEPARTMENT_OTHER)

## 2024-02-28 ENCOUNTER — Other Ambulatory Visit: Payer: Self-pay

## 2024-02-28 ENCOUNTER — Encounter (HOSPITAL_BASED_OUTPATIENT_CLINIC_OR_DEPARTMENT_OTHER): Payer: Self-pay | Admitting: Emergency Medicine

## 2024-02-28 ENCOUNTER — Emergency Department (HOSPITAL_BASED_OUTPATIENT_CLINIC_OR_DEPARTMENT_OTHER)
Admission: EM | Admit: 2024-02-28 | Discharge: 2024-02-28 | Disposition: A | Discharge status: Home / Self Care | Attending: Emergency Medicine | Admitting: Emergency Medicine

## 2024-02-28 DIAGNOSIS — S61012A Laceration without foreign body of left thumb without damage to nail, initial encounter: Secondary | ICD-10-CM | POA: Diagnosis present

## 2024-02-28 DIAGNOSIS — W268XXA Contact with other sharp object(s), not elsewhere classified, initial encounter: Secondary | ICD-10-CM | POA: Diagnosis not present

## 2024-02-28 DIAGNOSIS — Z23 Encounter for immunization: Secondary | ICD-10-CM | POA: Insufficient documentation

## 2024-02-28 MED ORDER — TETANUS-DIPHTH-ACELL PERTUSSIS 5-2.5-18.5 LF-MCG/0.5 IM SUSY
0.5000 mL | PREFILLED_SYRINGE | Freq: Once | INTRAMUSCULAR | Status: AC
  Administered 2024-02-28: 0.5 mL via INTRAMUSCULAR
  Filled 2024-02-28: qty 0.5

## 2024-02-28 MED ORDER — LIDOCAINE HCL (PF) 1 % IJ SOLN
10.0000 mL | Freq: Once | INTRAMUSCULAR | Status: AC
  Administered 2024-02-28: 10 mL
  Filled 2024-02-28: qty 10

## 2024-02-28 MED ORDER — ACETAMINOPHEN 500 MG PO TABS
1000.0000 mg | ORAL_TABLET | Freq: Once | ORAL | Status: AC
  Administered 2024-02-28: 1000 mg via ORAL
  Filled 2024-02-28: qty 2

## 2024-02-28 NOTE — ED Provider Notes (Signed)
 [place] EMERGENCY DEPARTMENT AT [facility] Provider Note   CSN: [id number] Arrival date & time: 02/28/24  1233     Patient presents with: Laceration   Dustin Pearson is a 63 y.o. right hand dominant male with no significant past medical history who presents with concern for a laceration to the left thumb that occurred about 30 minutes prior to arrival.  Reports he cut his thumb with a razor blade at work.  He reports bleeding has been well-controlled.  Unknown when last tetanus was.  Reports difficulty with full range of motion of the thumb.  Denies any numbness in the thumb.    Laceration      Prior to Admission medications   Medication Sig Start Date End Date Taking? Authorizing Provider  acyclovir  (ZOVIRAX ) 400 MG tablet Take 1 tablet by mouth 2 (two) times daily. 08/14/15   [provider]  benzonatate  (TESSALON ) 100 MG capsule Take 1 capsule (100 mg total) by mouth every 8 (eight) hours. 11/24/23   Kingsley, Victoria K, DO  cyclobenzaprine  (FLEXERIL ) 10 MG tablet Take 10 mg by mouth 2 (two) times daily.    [provider]  DULoxetine  (CYMBALTA ) 20 MG capsule Take 20 mg by mouth 2 (two) times daily. 04/27/20   [provider]  Multiple Vitamin (MULTIVITAMIN WITH MINERALS) TABS tablet Take 1 tablet by mouth daily.    [provider]  omeprazole (PRILOSEC) 20 MG capsule Take 20 mg by mouth daily. 09/28/20   [provider]  rosuvastatin  (CRESTOR ) 10 MG tablet Take 10 mg by mouth at bedtime. 09/28/20   [provider]  tiZANidine  (ZANAFLEX ) 4 MG tablet Take 4 mg by mouth at bedtime. 10/21/20   [provider]  traZODone (DESYREL) 50 MG tablet Take 50 mg by mouth at bedtime.    [provider]    Allergies: Gabapentin    Review of Systems  Skin:  Positive for wound.    Updated Vital Signs BP (!) 137/90   Pulse 81   Temp 98.4 F (36.9 C)   Resp 17   SpO2 97%   Physical Exam Vitals and nursing  note reviewed.  Constitutional:      Appearance: Normal appearance.  HENT:     Head: Atraumatic.  Cardiovascular:     Comments: 2+ radial pulse bilaterally Cap refill under 2 seconds in left thumb Pulmonary:     Effort: Pulmonary effort is normal.  Musculoskeletal:     Comments: Right upper extremity:  General 2cm laceration to the dorsal aspect of the base of the thumb.  Bleeding well controlled. No obvious foreign body. Not able to visualize any exposed bone. No erythema, edema, contusions  Palpation Tender to the 1st metacarpal and proximal phalanx of the thumb  ROM Full thumb opposition, flexion, abduction, adduction. 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     Mental Status: He is alert.  Psychiatric:        Mood and Affect: Mood normal.        Behavior: Behavior normal.       (all labs ordered are listed, but only abnormal results are displayed) Labs Reviewed - No data to display  EKG: None  Radiology: DG Finger Thumb Left Result Date: 02/28/2024 CLINICAL DATA:  Left thumb laceration. EXAM: LEFT THUMB 2+V COMPARISON:  None Available. FINDINGS: There is no evidence of fracture or dislocation. There is no evidence of arthropathy or other focal bone abnormality. Soft tissues are unremarkable. IMPRESSION: Negative. Electronically Signed   By: Lynwood Landy Raddle M.D.   On: 02/28/2024 13:34     .Laceration Repair  Date/Time: 02/28/2024 4:27 PM  Performed by: Veta Palma, PA-C Authorized by: Veta Palma, PA-C   Consent:    Consent obtained:  Verbal   Consent given by:  Patient   Risks, benefits, and alternatives were discussed: yes     Risks discussed:  Infection, pain, poor cosmetic result, tendon damage, retained foreign body, vascular damage, poor wound  healing, nerve damage and need for additional repair   Alternatives discussed:  No treatment Universal protocol:    Procedure explained and questions answered to patient or proxy's satisfaction: yes     Imaging studies available: yes     Patient identity confirmed:  Verbally with patient Anesthesia:    Anesthesia method:  Nerve block   Block needle gauge:  25 G   Block anesthetic:  Lidocaine 1% w/o epi (5ml)   Block injection procedure:  Introduced needle, anatomic landmarks identified, anatomic landmarks palpated, negative aspiration for blood and incremental injection   Block outcome:  Anesthesia achieved Laceration details:    Location:  Finger   Finger location:  L thumb   Length (cm):  2   Depth (mm):  1 Pre-procedure details:    Preparation:  Patient was prepped and draped in usual sterile fashion and imaging obtained to evaluate for foreign bodies Exploration:    Imaging obtained: x-ray     Imaging outcome: foreign body not noted     Wound exploration: wound explored through full range of motion and entire depth of wound visualized     Wound extent: tendon damage     Wound extent: no foreign body, no nerve damage, no underlying fracture and no vascular damage     Tendon repair plan:  Refer for evaluation Treatment:    Area cleansed with:  Chlorhexidine   Amount of cleaning:  Standard   Irrigation solution:  Sterile saline   Irrigation volume:  1L   Debridement:  None   Undermining:  None Skin repair:    Repair method:  Sutures   Suture size:  5-0   Suture material:  Prolene   Suture technique:  Simple interrupted   Number of sutures:  3 Repair type:    Repair type:  Simple Post-procedure details:    Dressing:  Antibiotic ointment, sterile dressing and bulky dressing   Procedure completion:  Tolerated well, no immediate complications    Medications Ordered in the ED  lidocaine (PF) (XYLOCAINE) 1 % injection 10 mL (10 mLs Infiltration Given 02/28/24 1326)  Tdap  (BOOSTRIX ) injection 0.5 mL (0.5 mLs Intramuscular Given 02/28/24 1326)  acetaminophen  (TYLENOL ) tablet 1,000 mg (1,000 mg Oral Given 02/28/24 1326)    Clinical Course as of 02/28/24 1630  Wed Feb 28, 2024  1404 Dr. Shari with hand consulted, he reccomends follow-up in office either tomorrow 10/2 or Friday 10/3 as there is concern for possible extensor tendon injury [AF]    Clinical Course User Index [AF] Veta Palma, PA-C                                 Medical Decision Making Amount and/or Complexity of Data Reviewed Radiology: ordered.  Risk OTC drugs. Prescription drug  management.    Differential diagnosis includes but is not limited to laceration, wound infection, tendon injury, nerve injury, vascular injury, retained foreign body, fracture, dislocation  ED Course:  Upon initial evaluation, patient is well-appearing, no acute distress.  Sustained laceration to the base of the left thumb on the dorsal aspect and just prior to arrival.  Bleeding well-controlled.  No visualized foreign debris or bone.  Remains neurovascularly intact in the left thumb.  Difficulty with full thumb extension, but otherwise intact range of motion of the left thumb. This is concerning for possible extensor tendon injury.    Imaging Studies ordered: I ordered imaging studies including left thumb x-ray  I independently visualized the imaging with scope of interpretation limited to determining acute life threatening conditions related to emergency care. Imaging showed  No acute abnormalities I agree with the radiologist interpretation  Consultations Obtained: I requested consultation with the hand Dr. Shari,  and discussed lab and imaging findings as well as pertinent plan - they recommend: Follow-up with him in the office either tomorrow (10/2) or Friday (10/3) to evaluate patient's thumb injury and possible tendon injury. He requests patient call to schedule this appointment   Medications  Given: Tylenol  Tdap Lidocaine  Imaging was reviewed which revealed no retained foreign body or other acute injury. Patient's wound was irrigated well with sterile saline and cleaned with chlorhexidine swabs. Anesthesia achieved with thumb nerve block with lidocaine. Patient's laceration was repaired with 3, 5-0 prolene simple interrupted sutures. Patient tolerated this well without any immediate complications. Suture repair occurred less than 24 hours after initial injury. Patient remains neurovascularly intact after suture placement. Patient's Tdap was updated. Patient stable and appropriate for discharge home.     Impression: Left thumb Laceration  Disposition:  Patient discharged home with instructions to keep laceration site clean with gentle soap and water. Apply neosporin or bacitracin over the area as shown here and keep covered with sterile dressing. Follow up with PCP or return to ER in 10-14 days for suture/staple removal. Tylenol  and ibuprofen as needed for pain. He understands he needs to call to schedule an appointment with Dr. Shari with ortho (hand) for follow-up of potential tendon injury. Return precautions given.    This chart was dictated using voice recognition software, Dragon. Despite the best efforts of this provider to proofread and correct errors, errors may still occur which can change documentation meaning.       Final diagnoses:  Laceration of left thumb without foreign body without damage to nail, initial encounter    ED Discharge Orders     None          Veta Palma, PA-C 02/28/24 1630    Mannie Pac T, DO 03/04/24 [phone]

## 2024-02-28 NOTE — Discharge Instructions (Addendum)
 You had 3 non-absorbable sutures placed today. You must get your sutures rechecked for possible removal in 10-14 days. We recommend visiting your PCP or an urgent care for suture removal. However, you may also return back to the ER if you are unable to be seen by your PCP or at urgent care.   Follow-up with the hand doctor, Dr. Shari, either tomorrow or Friday.  You must call to schedule this appointment.  You may gently clean the area around your laceration as needed with water. Place antibiotic ointment such as bacitracin or neosporin over your laceration after cleaning the area.  Keep the laceration covered with sterile gauze as shown here if you are doing an activity in which it may get dirty. You may pick these supplies up at any drugstore.  Do not submerge your laceration in water (no baths, swimming) until it is fully healed. You may shower.   You may take up to 1000mg  of tylenol  every 6 hours as needed for pain. Do not take more then 4g per day.   You may use up to 600mg  ibuprofen every 6 hours as needed for pain.  Do not exceed 2.4g of ibuprofen per day.  Your tetanus was updated today.  Return to the ER should you develop fever, chills, pus drainage from your wound, redness around your wound.

## 2024-02-28 NOTE — ED Triage Notes (Signed)
 Lac to left thumb. Concerned for ligament involvement. Unable to move finger and feels pulling in elbow
